# Patient Record
Sex: Female | Born: 1935
Health system: Southern US, Community
[De-identification: ages and names within clinical notes are randomized; demographics above are authoritative.]

## PROBLEM LIST (undated history)

## (undated) DIAGNOSIS — E785 Hyperlipidemia, unspecified: Secondary | ICD-10-CM

## (undated) DIAGNOSIS — F430 Acute stress reaction: Secondary | ICD-10-CM

## (undated) DIAGNOSIS — I679 Cerebrovascular disease, unspecified: Secondary | ICD-10-CM

## (undated) DIAGNOSIS — M1990 Unspecified osteoarthritis, unspecified site: Secondary | ICD-10-CM

## (undated) DIAGNOSIS — I471 Supraventricular tachycardia, unspecified: Secondary | ICD-10-CM

## (undated) DIAGNOSIS — M810 Age-related osteoporosis without current pathological fracture: Secondary | ICD-10-CM

## (undated) DIAGNOSIS — I872 Venous insufficiency (chronic) (peripheral): Secondary | ICD-10-CM

## (undated) DIAGNOSIS — N3010 Interstitial cystitis (chronic) without hematuria: Secondary | ICD-10-CM

## (undated) DIAGNOSIS — F419 Anxiety disorder, unspecified: Secondary | ICD-10-CM

## (undated) DIAGNOSIS — I639 Cerebral infarction, unspecified: Secondary | ICD-10-CM

## (undated) DIAGNOSIS — K589 Irritable bowel syndrome without diarrhea: Secondary | ICD-10-CM

## (undated) DIAGNOSIS — F5104 Psychophysiologic insomnia: Secondary | ICD-10-CM

## (undated) DIAGNOSIS — Z8619 Personal history of other infectious and parasitic diseases: Secondary | ICD-10-CM

## (undated) DIAGNOSIS — I10 Essential (primary) hypertension: Secondary | ICD-10-CM

## (undated) DIAGNOSIS — D649 Anemia, unspecified: Secondary | ICD-10-CM

## (undated) DIAGNOSIS — E559 Vitamin D deficiency, unspecified: Secondary | ICD-10-CM

## (undated) DIAGNOSIS — J309 Allergic rhinitis, unspecified: Secondary | ICD-10-CM

## (undated) DIAGNOSIS — E871 Hypo-osmolality and hyponatremia: Secondary | ICD-10-CM

## (undated) DIAGNOSIS — J45909 Unspecified asthma, uncomplicated: Secondary | ICD-10-CM

## (undated) DIAGNOSIS — K5730 Diverticulosis of large intestine without perforation or abscess without bleeding: Secondary | ICD-10-CM

## (undated) DIAGNOSIS — B0229 Other postherpetic nervous system involvement: Secondary | ICD-10-CM

## (undated) DIAGNOSIS — K580 Irritable bowel syndrome with diarrhea: Secondary | ICD-10-CM

## (undated) DIAGNOSIS — R413 Other amnesia: Secondary | ICD-10-CM

## (undated) HISTORY — DX: Cerebral infarction, unspecified: I63.9

## (undated) HISTORY — DX: Interstitial cystitis (chronic) without hematuria: N30.10

## (undated) HISTORY — DX: Psychophysiologic insomnia: F51.04

## (undated) HISTORY — DX: Personal history of other infectious and parasitic diseases: Z86.19

## (undated) HISTORY — DX: Anemia, unspecified: D64.9

## (undated) HISTORY — DX: Venous insufficiency (chronic) (peripheral): I87.2

## (undated) HISTORY — DX: Other postherpetic nervous system involvement: B02.29

## (undated) HISTORY — DX: Other amnesia: R41.3

## (undated) HISTORY — DX: Age-related osteoporosis without current pathological fracture: M81.0

## (undated) HISTORY — DX: Anxiety disorder, unspecified: F41.9

## (undated) HISTORY — DX: Vitamin D deficiency, unspecified: E55.9

## (undated) HISTORY — DX: Hyperlipidemia, unspecified: E78.5

## (undated) HISTORY — DX: Unspecified asthma, uncomplicated: J45.909

## (undated) HISTORY — DX: Essential (primary) hypertension: I10

## (undated) HISTORY — DX: Irritable bowel syndrome with diarrhea: K58.0

## (undated) HISTORY — DX: Diverticulosis of large intestine without perforation or abscess without bleeding: K57.30

## (undated) HISTORY — DX: Supraventricular tachycardia: I47.1

## (undated) HISTORY — PX: APPENDECTOMY: SHX54

## (undated) HISTORY — DX: Irritable bowel syndrome without diarrhea: K58.9

## (undated) HISTORY — DX: Allergic rhinitis, unspecified: J30.9

## (undated) HISTORY — DX: Cerebrovascular disease, unspecified: I67.9

## (undated) HISTORY — PX: TONSILLECTOMY: SUR1361

## (undated) HISTORY — DX: Hypo-osmolality and hyponatremia: E87.1

## (undated) HISTORY — DX: Supraventricular tachycardia, unspecified: I47.10

## (undated) HISTORY — DX: Unspecified osteoarthritis, unspecified site: M19.90

## (undated) HISTORY — PX: DILATION AND CURETTAGE OF UTERUS: SHX78

## (undated) HISTORY — DX: Acute stress reaction: F43.0

---

## 1997-09-18 ENCOUNTER — Other Ambulatory Visit: Admission: RE | Admit: 1997-09-18 | Discharge: 1997-09-18 | Payer: Self-pay | Admitting: Obstetrics and Gynecology

## 1997-09-30 ENCOUNTER — Other Ambulatory Visit: Admission: RE | Admit: 1997-09-30 | Discharge: 1997-09-30 | Payer: Self-pay | Admitting: Obstetrics and Gynecology

## 1998-11-18 ENCOUNTER — Encounter: Payer: Self-pay | Admitting: Internal Medicine

## 1999-02-10 ENCOUNTER — Other Ambulatory Visit: Admission: RE | Admit: 1999-02-10 | Discharge: 1999-02-10 | Payer: Self-pay | Admitting: Obstetrics and Gynecology

## 1999-02-11 ENCOUNTER — Encounter: Payer: Self-pay | Admitting: Obstetrics and Gynecology

## 1999-02-11 ENCOUNTER — Encounter: Admission: RE | Admit: 1999-02-11 | Discharge: 1999-02-11 | Payer: Self-pay | Admitting: Obstetrics and Gynecology

## 1999-04-15 ENCOUNTER — Encounter: Admission: RE | Admit: 1999-04-15 | Discharge: 1999-04-15 | Payer: Self-pay | Admitting: Obstetrics and Gynecology

## 1999-04-15 ENCOUNTER — Encounter: Payer: Self-pay | Admitting: Obstetrics and Gynecology

## 1999-05-02 ENCOUNTER — Other Ambulatory Visit: Admission: RE | Admit: 1999-05-02 | Discharge: 1999-05-02 | Payer: Self-pay | Admitting: Obstetrics and Gynecology

## 1999-05-02 ENCOUNTER — Encounter (INDEPENDENT_AMBULATORY_CARE_PROVIDER_SITE_OTHER): Payer: Self-pay

## 2000-05-30 ENCOUNTER — Encounter: Admission: RE | Admit: 2000-05-30 | Discharge: 2000-05-30 | Payer: Self-pay | Admitting: Obstetrics and Gynecology

## 2000-05-30 ENCOUNTER — Encounter: Payer: Self-pay | Admitting: Obstetrics and Gynecology

## 2000-05-30 ENCOUNTER — Other Ambulatory Visit: Admission: RE | Admit: 2000-05-30 | Discharge: 2000-05-30 | Payer: Self-pay | Admitting: Obstetrics and Gynecology

## 2001-05-28 ENCOUNTER — Encounter: Payer: Self-pay | Admitting: Obstetrics and Gynecology

## 2001-05-28 ENCOUNTER — Encounter: Admission: RE | Admit: 2001-05-28 | Discharge: 2001-05-28 | Payer: Self-pay | Admitting: Obstetrics and Gynecology

## 2001-05-28 ENCOUNTER — Other Ambulatory Visit: Admission: RE | Admit: 2001-05-28 | Discharge: 2001-05-28 | Payer: Self-pay | Admitting: Obstetrics and Gynecology

## 2002-08-26 ENCOUNTER — Other Ambulatory Visit: Admission: RE | Admit: 2002-08-26 | Discharge: 2002-08-26 | Payer: Self-pay | Admitting: Obstetrics and Gynecology

## 2002-08-26 ENCOUNTER — Encounter: Admission: RE | Admit: 2002-08-26 | Discharge: 2002-08-26 | Payer: Self-pay | Admitting: Obstetrics and Gynecology

## 2002-08-26 ENCOUNTER — Encounter: Payer: Self-pay | Admitting: Obstetrics and Gynecology

## 2002-09-04 ENCOUNTER — Encounter: Admission: RE | Admit: 2002-09-04 | Discharge: 2002-09-04 | Payer: Self-pay | Admitting: Obstetrics and Gynecology

## 2002-09-04 ENCOUNTER — Encounter: Payer: Self-pay | Admitting: Obstetrics and Gynecology

## 2003-09-15 ENCOUNTER — Other Ambulatory Visit: Admission: RE | Admit: 2003-09-15 | Discharge: 2003-09-15 | Payer: Self-pay | Admitting: Obstetrics and Gynecology

## 2003-09-15 ENCOUNTER — Encounter: Admission: RE | Admit: 2003-09-15 | Discharge: 2003-09-15 | Payer: Self-pay | Admitting: Obstetrics and Gynecology

## 2004-07-12 ENCOUNTER — Ambulatory Visit: Payer: Self-pay | Admitting: Internal Medicine

## 2004-07-25 ENCOUNTER — Ambulatory Visit: Payer: Self-pay | Admitting: Internal Medicine

## 2004-09-16 ENCOUNTER — Encounter: Admission: RE | Admit: 2004-09-16 | Discharge: 2004-09-16 | Payer: Self-pay | Admitting: Obstetrics and Gynecology

## 2004-09-16 ENCOUNTER — Other Ambulatory Visit: Admission: RE | Admit: 2004-09-16 | Discharge: 2004-09-16 | Payer: Self-pay | Admitting: Obstetrics and Gynecology

## 2005-09-18 ENCOUNTER — Other Ambulatory Visit: Admission: RE | Admit: 2005-09-18 | Discharge: 2005-09-18 | Payer: Self-pay | Admitting: Obstetrics & Gynecology

## 2005-09-18 ENCOUNTER — Encounter: Admission: RE | Admit: 2005-09-18 | Discharge: 2005-09-18 | Payer: Self-pay | Admitting: Obstetrics and Gynecology

## 2006-01-05 ENCOUNTER — Ambulatory Visit: Payer: Self-pay | Admitting: Internal Medicine

## 2006-06-17 ENCOUNTER — Inpatient Hospital Stay (HOSPITAL_COMMUNITY): Admission: EM | Admit: 2006-06-17 | Discharge: 2006-06-18 | Payer: Self-pay | Admitting: Emergency Medicine

## 2006-06-17 ENCOUNTER — Ambulatory Visit: Payer: Self-pay | Admitting: Cardiology

## 2006-07-03 ENCOUNTER — Ambulatory Visit: Payer: Self-pay

## 2006-07-27 ENCOUNTER — Ambulatory Visit: Payer: Self-pay | Admitting: Cardiology

## 2006-08-20 ENCOUNTER — Ambulatory Visit: Payer: Self-pay | Admitting: Internal Medicine

## 2006-09-27 ENCOUNTER — Encounter: Admission: RE | Admit: 2006-09-27 | Discharge: 2006-09-27 | Payer: Self-pay | Admitting: Obstetrics and Gynecology

## 2006-09-27 ENCOUNTER — Other Ambulatory Visit: Admission: RE | Admit: 2006-09-27 | Discharge: 2006-09-27 | Payer: Self-pay | Admitting: Obstetrics and Gynecology

## 2006-12-25 ENCOUNTER — Ambulatory Visit: Payer: Self-pay | Admitting: Internal Medicine

## 2007-06-11 ENCOUNTER — Encounter: Admission: RE | Admit: 2007-06-11 | Discharge: 2007-06-11 | Payer: Self-pay | Admitting: Dermatology

## 2007-07-04 ENCOUNTER — Encounter: Admission: RE | Admit: 2007-07-04 | Discharge: 2007-07-04 | Payer: Self-pay | Admitting: Diagnostic Radiology

## 2007-07-11 ENCOUNTER — Encounter: Admission: RE | Admit: 2007-07-11 | Discharge: 2007-07-11 | Payer: Self-pay | Admitting: Diagnostic Radiology

## 2007-07-30 ENCOUNTER — Encounter: Admission: RE | Admit: 2007-07-30 | Discharge: 2007-07-30 | Payer: Self-pay | Admitting: Diagnostic Radiology

## 2007-07-30 ENCOUNTER — Ambulatory Visit: Payer: Self-pay | Admitting: Cardiology

## 2007-09-30 ENCOUNTER — Other Ambulatory Visit: Admission: RE | Admit: 2007-09-30 | Discharge: 2007-09-30 | Payer: Self-pay | Admitting: Obstetrics and Gynecology

## 2007-09-30 ENCOUNTER — Encounter: Admission: RE | Admit: 2007-09-30 | Discharge: 2007-09-30 | Payer: Self-pay | Admitting: Obstetrics and Gynecology

## 2007-10-08 ENCOUNTER — Encounter: Admission: RE | Admit: 2007-10-08 | Discharge: 2007-10-08 | Payer: Self-pay | Admitting: Obstetrics and Gynecology

## 2008-01-21 ENCOUNTER — Encounter: Admission: RE | Admit: 2008-01-21 | Discharge: 2008-01-21 | Payer: Self-pay | Admitting: Interventional Radiology

## 2008-02-18 ENCOUNTER — Encounter: Admission: RE | Admit: 2008-02-18 | Discharge: 2008-02-18 | Payer: Self-pay | Admitting: Dermatology

## 2008-05-06 ENCOUNTER — Telehealth: Payer: Self-pay | Admitting: Internal Medicine

## 2008-05-06 DIAGNOSIS — K589 Irritable bowel syndrome without diarrhea: Secondary | ICD-10-CM

## 2008-05-06 DIAGNOSIS — K5900 Constipation, unspecified: Secondary | ICD-10-CM

## 2008-05-06 DIAGNOSIS — K5730 Diverticulosis of large intestine without perforation or abscess without bleeding: Secondary | ICD-10-CM | POA: Insufficient documentation

## 2008-05-06 DIAGNOSIS — I679 Cerebrovascular disease, unspecified: Secondary | ICD-10-CM | POA: Insufficient documentation

## 2008-05-06 DIAGNOSIS — E785 Hyperlipidemia, unspecified: Secondary | ICD-10-CM

## 2008-05-06 HISTORY — DX: Irritable bowel syndrome, unspecified: K58.9

## 2008-05-06 HISTORY — DX: Constipation, unspecified: K59.00

## 2008-05-11 ENCOUNTER — Ambulatory Visit: Payer: Self-pay | Admitting: Internal Medicine

## 2008-05-12 ENCOUNTER — Telehealth: Payer: Self-pay | Admitting: Internal Medicine

## 2008-05-12 ENCOUNTER — Ambulatory Visit: Payer: Self-pay | Admitting: Internal Medicine

## 2008-06-22 ENCOUNTER — Telehealth: Payer: Self-pay | Admitting: Internal Medicine

## 2008-07-28 ENCOUNTER — Ambulatory Visit: Payer: Self-pay | Admitting: Internal Medicine

## 2008-07-28 DIAGNOSIS — J302 Other seasonal allergic rhinitis: Secondary | ICD-10-CM

## 2008-07-28 DIAGNOSIS — J3089 Other allergic rhinitis: Secondary | ICD-10-CM

## 2008-07-28 HISTORY — DX: Other seasonal allergic rhinitis: J30.89

## 2008-07-28 HISTORY — DX: Other seasonal allergic rhinitis: J30.2

## 2008-09-25 IMAGING — US IR TRANSCATH EMBOLIZATION NON-NEURO EA OP FIELD
1 series · 2 of 2 positions shown · non-contrast
Comparison: none

CLINICAL DATA: Left leg pain with superficial venous reflux disease
and varicosities.

[Series 1: ir transcath embolization non-neuro ea op field · 0.06mm/px · 2 of 2 slices shown]
[im 1/2]
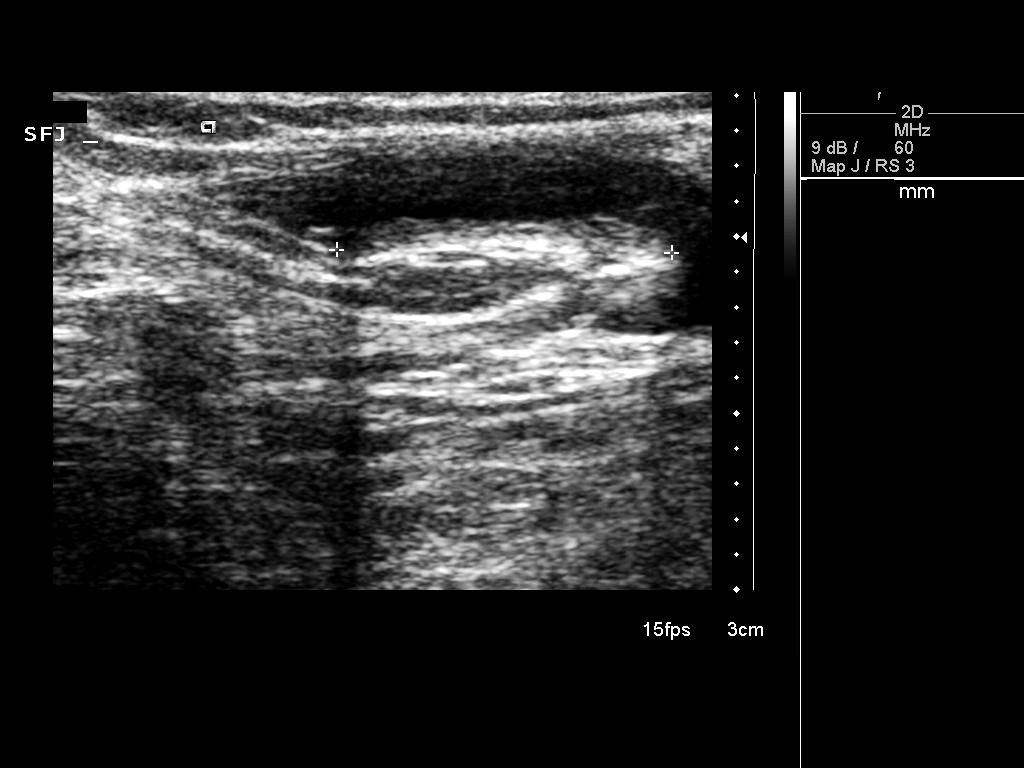
[im 2/2]
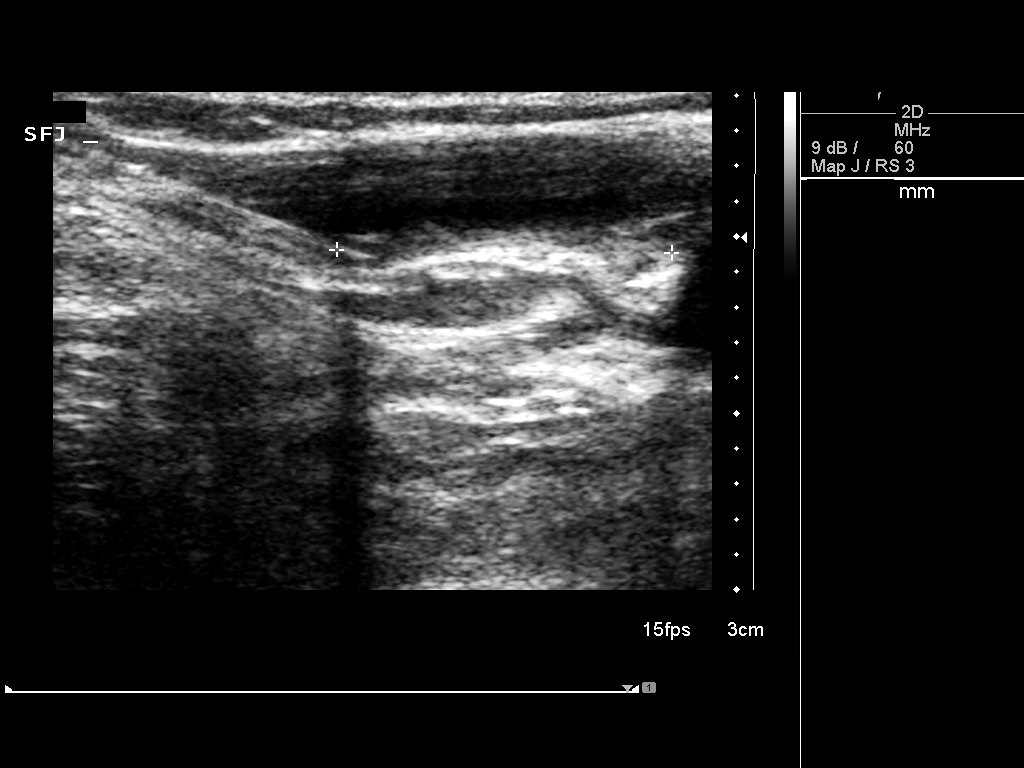

[2 of 2 positions shown; findings below may reference images not displayed]

ENDOVASCULAR LASER TREATMENT OF THE LEFT GREAT SAPHENOUS VEIN

Laser Details:  Laser fiber diameter:  600 microns; power 10 Ammie;

Procedure:  The risks of the procedure including bleeding,
infection and DVT were explained to the patient.  Informed consent
was obtained.  The patient was placed supine on the ultrasound
examination table.  The left lower extremity was evaluated with
duplex ultrasound.  Multiple varicosities were identified near the
knee.  The great saphenous vein was markedly dilated in the distal
thigh region.  The left thigh and upper calf region were prepped
with Betadine and a sterile drape was placed.  Skin was
anesthetized with 1% lidocaine.  A 21 gauge needle was directed
into the great saphenous vein just below the knee and above the
large varicosities.  A micropuncture dilator set was placed.  The
vascular sheath was positioned in the proximal great saphenous vein
a few centimeters distal to the saphenofemoral junction.  The 600
micron laser was placed within the sheath and the tip was
positioned 18 mm from the saphenofemoral junction.  Dilute
lidocaine was used for tumescence along the length of the great
saphenous vein.  Additional tumescence was used in the distal thigh
due to the superficial location of the great saphenous vein in this
area.  The laser position was again confirmed with ultrasound.  The
laser and sheath were pulled back as a single unit for 158 seconds.
The patient tolerated procedure well without complication.  Sterile
dressings were placed over the puncture sites and the patient was
placed in her compression stockings.  The patient ambulated prior
to being discharged. There were no immediate complications.
IMPRESSION: Endovascular laser treatment of the left great saphenous vein
starting just below the left knee.  The patient tolerated the
procedure well and will return for a follow-up visit in 1 week.

## 2008-09-30 ENCOUNTER — Encounter: Admission: RE | Admit: 2008-09-30 | Discharge: 2008-09-30 | Payer: Self-pay | Admitting: Obstetrics and Gynecology

## 2008-10-21 IMAGING — US US EXTREM LOW VENOUS*L*
1 series · 13 of 24 positions shown · non-contrast
Comparison: 07/11/2007

CLINICAL DATA: 1 month status post endovenous laser occlusion of
the left great saphenous vein on 07/04/2007.

LEFT LOWER EXTREMITY VENOUS DUPLEX ULTRASOUND
TECHNIQUE: Gray-scale sonography with graded compression, as well
as color Doppler and duplex ultrasound, were performed to evaluate
the deep and superficial venous system of the left lower extremity
from the level of the common femoral vein through the popliteal and
proximal calf veins. Spectral Doppler was utilized to evaluate flow
at rest and with distal augmentation maneuvers.

[Series 1: us extrem low venous*left* · 13 of 28 slices shown]
[im 1/28]
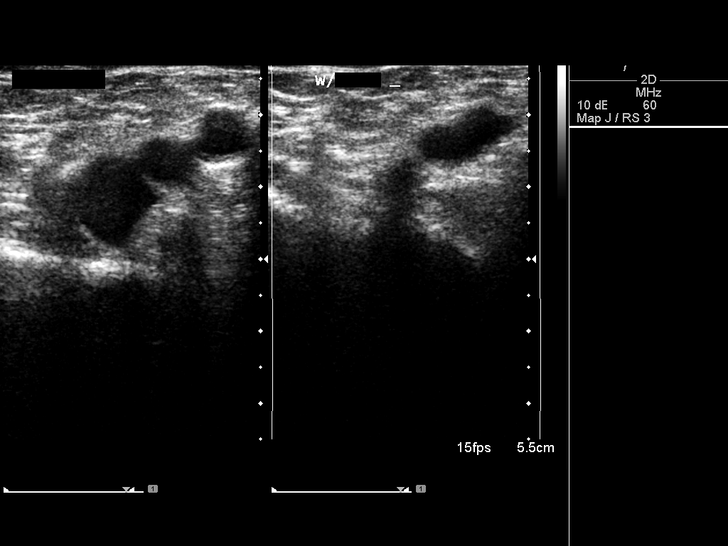
[im 3/28]
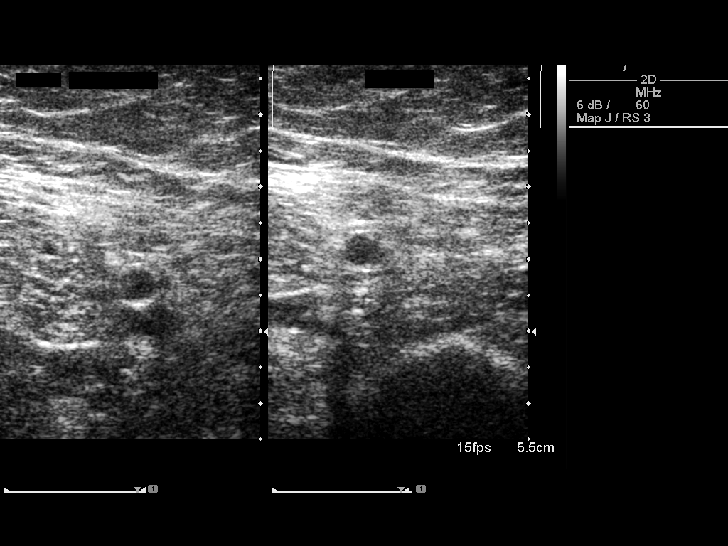
[im 5/28]
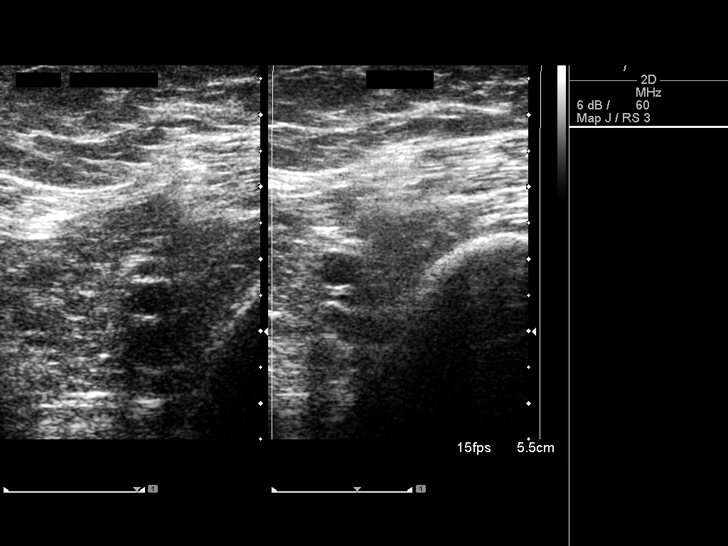
[im 8/28]
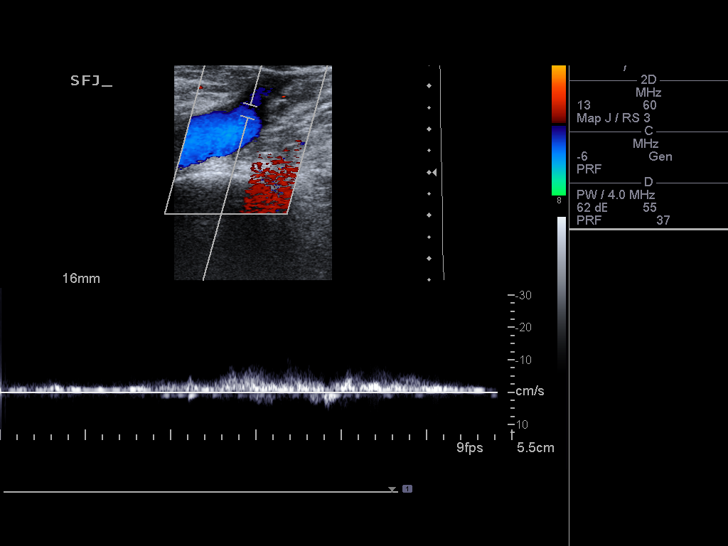
[im 10/28]
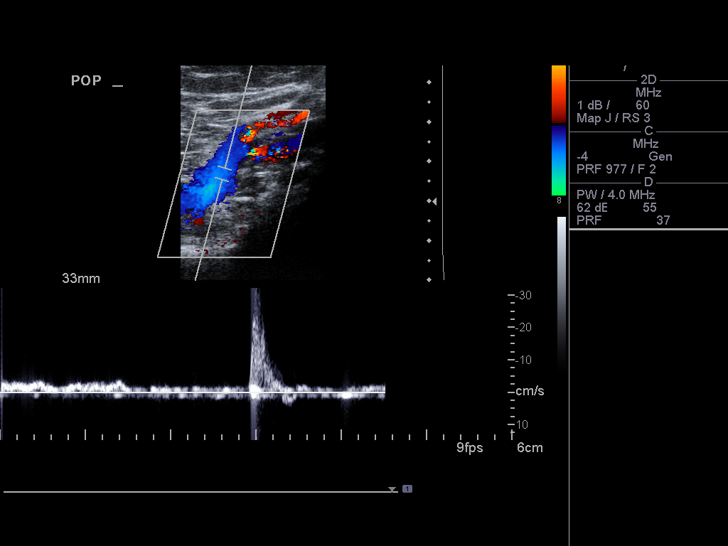
[im 12/28]
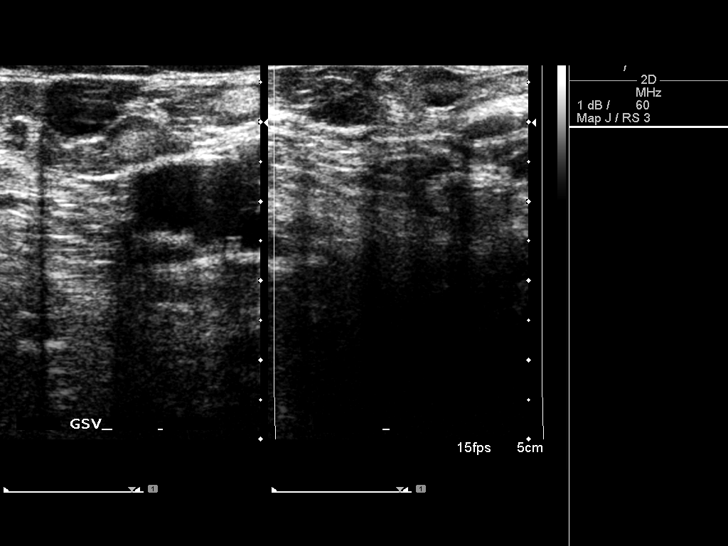
[im 15/28]
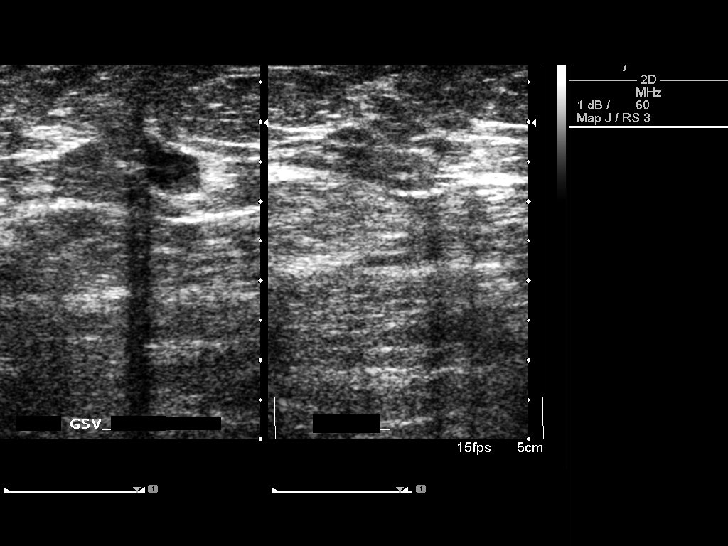
[im 16/28]
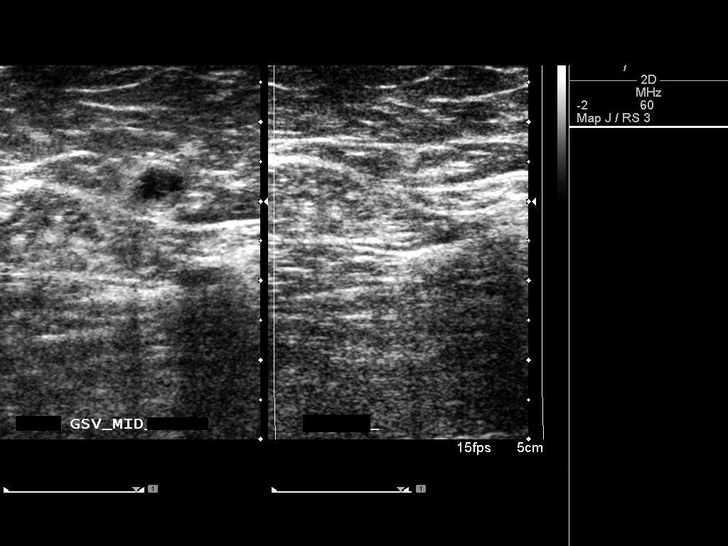
[im 18/28]
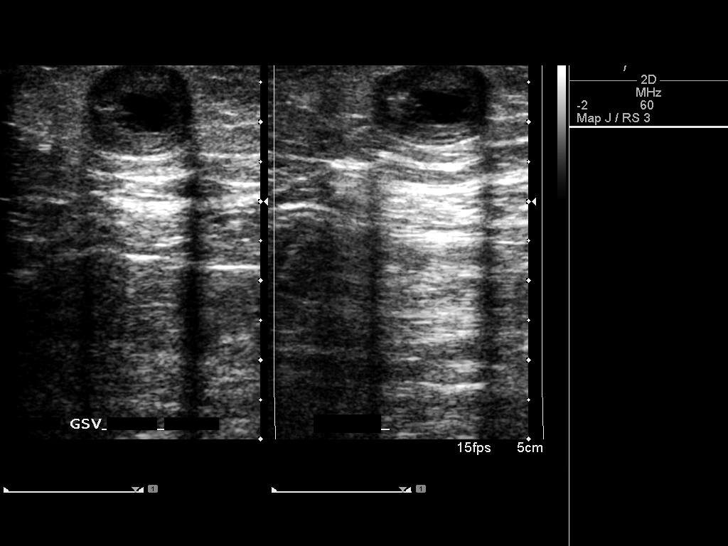
[im 20/28]
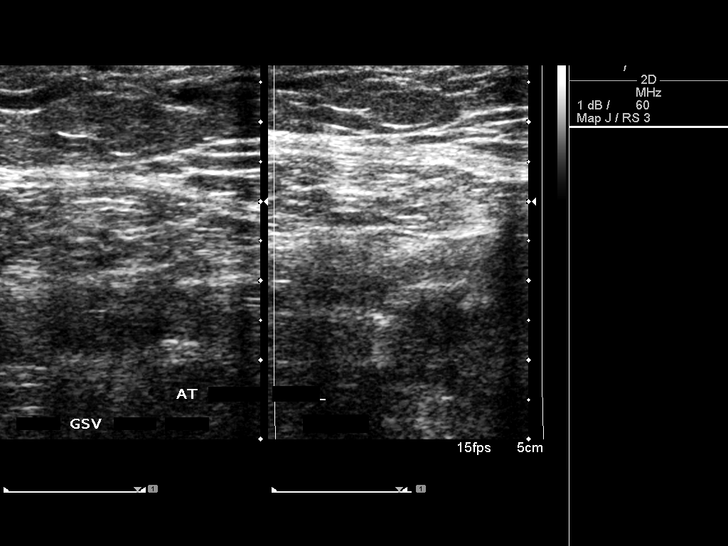
[im 23/28]
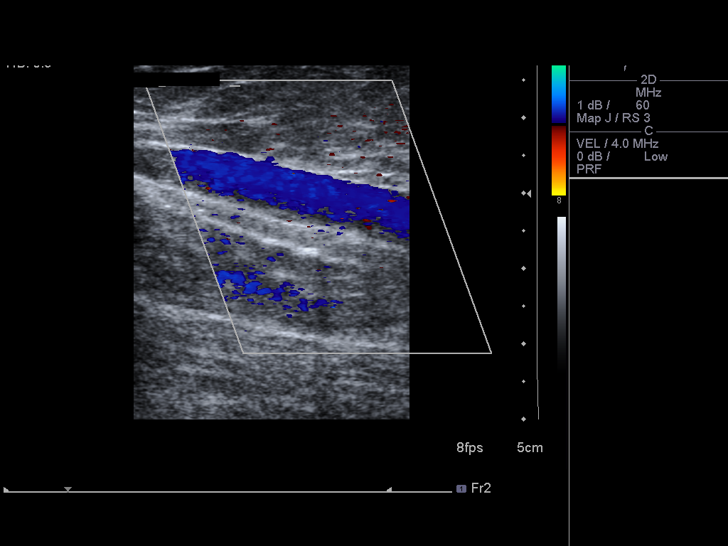
[im 25/28]
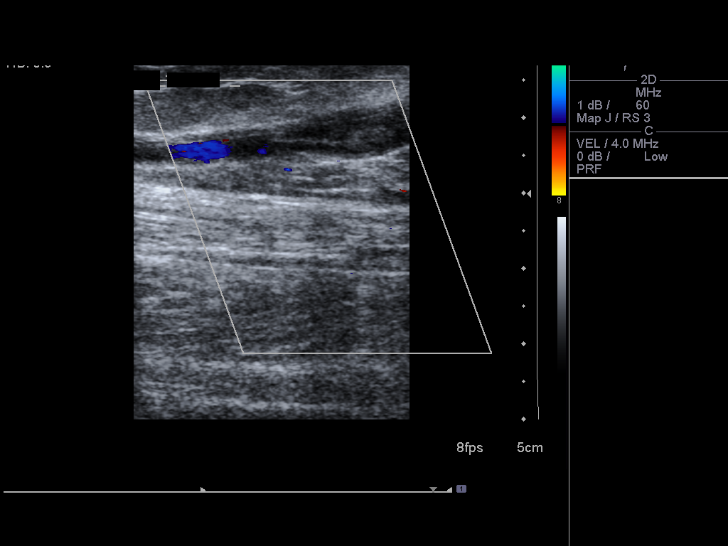
[im 28/28]
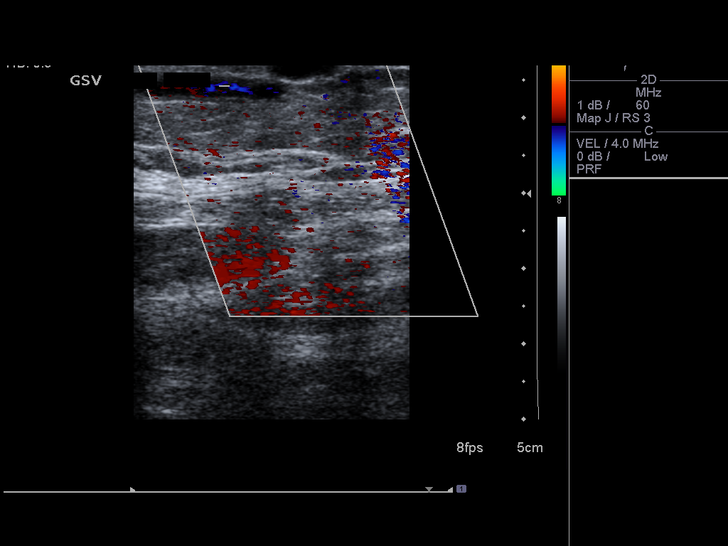

[13 of 24 positions shown; findings below may reference images not displayed]

FINDINGS: The deep venous system remains widely patent with no
evidence of DVT.  Deep veins show normal augmentation and
phasicity.

The saphenofemoral junction remains widely patent.  Proximal
segment of the great saphenous vein in the upper thigh is open.
The mid thigh segment shows partially occlusive thrombus in the
dependent aspect of the vein.  Distal GSV demonstrates complete
occlusion just above the knee including continued visualization of
thrombus in a dilated aneurysmal segment of the vein just above the
knee.

Below the knee, the great saphenous vein is largely thrombosed in
the proximal calf with a small amount of internal flow.  There is a
network of adjacent superficial varicosities which remain open.
These partially communicate with a segment of the great saphenous
vein in the proximal calf as well as via at least one perforator to
the deep venous system.
IMPRESSION: Partial thrombosis of treated left great saphenous vein.
Aneurysmal segment in the distal thigh shows complete thrombosis.
Other segments demonstrate nonocclusive thrombus.  Superficial
varicosities in the proximal calf remain open primarily due to
probable perforator supply.  One segment does appear to partially
communicate with a segment of the open great saphenous vein in the
proximal calf.

## 2008-11-30 ENCOUNTER — Telehealth: Payer: Self-pay | Admitting: Internal Medicine

## 2009-09-15 ENCOUNTER — Ambulatory Visit: Payer: Self-pay | Admitting: Internal Medicine

## 2009-09-15 DIAGNOSIS — I10 Essential (primary) hypertension: Secondary | ICD-10-CM

## 2009-09-15 HISTORY — DX: Essential (primary) hypertension: I10

## 2009-09-30 ENCOUNTER — Telehealth: Payer: Self-pay | Admitting: Internal Medicine

## 2009-10-01 ENCOUNTER — Encounter: Admission: RE | Admit: 2009-10-01 | Discharge: 2009-10-01 | Payer: Self-pay | Admitting: Obstetrics and Gynecology

## 2010-04-24 ENCOUNTER — Encounter: Payer: Self-pay | Admitting: Dermatology

## 2010-05-03 NOTE — Assessment & Plan Note (Signed)
Summary: 12 months/apc   Primary Provider/Referring Provider:  Celine Mans, MD  CC:  Follow up visit-"Dripping nose" x 3 months..  History of Present Illness: 12/25/06- 1. Allergic rhinitis. 2. Allergic conjunctivitis.  HISTORY:  One year followup for this never smoker who says Nasonex was working well until she ran out a few months ago.  She did not try to get it refilled.  She complains of "allergies" all summer, meaning watering eyes, nasal congestion, and a complaint that the skin of her face feels dry.  She asks if I would refill skin meds that she had been provided in the past by a dermatologist and uses sparingly for scaling and rash on the face.  There have been no chest complaints, nothing purulent or bloody.   07/28/08- allergic rhinitis, allergic conjunctivitis       Husband with her. Some postnasal drip. Discussed loss of daughter in [place]. Had a little wheeze/ cough this spring. Denies fever, sore throat, or purulent discharge. Never felt need for asthma inhaler. Blood pressure labile - ER x 2 or 3. Denies need for bronchodilator or decongestant and we discussed meds including her BP meds.  September 15, 2009- Allergic rhinitis, allergic conjunctivitis, We had given sample Astepro to try. Now says nose has been running several months- waited to try nasonex until seen here, but says the sample of Astepro was good. No let-up yet with advancing season. Mild eye itching, no wheeze or cough.  Now on pain meds for Shingles pain left buttock.  Preventive Screening-Counseling & Management  Alcohol-Tobacco     Smoking Status: never     Tobacco Counseling: not indicated; no tobacco use  Current Medications (verified): 1)  Metoprolol Tartrate 50 Mg Tabs (Metoprolol Tartrate) .... Take 1 1/2 By Mouth  Every Morning and 2 By Mouth At Bedtime 2)  Avapro 300 Mg Tabs (Irbesartan) .... One Tablet By Mouth Once Daily 3)  Fish Oil 1000 Mg Caps (Omega-3 Fatty Acids) .... One Capsule By  Mouth Once Daily 4)  Multivitamins   Tabs (Multiple Vitamin) .... One Tablet By Mouth Once Daily 5)  Calcium Citrate-Vitamin D 1500-200 Mg-Unit Tabs (Calcium Citrate-Vitamin D) .... One Tablet By Mouth Once Daily 6)  Adult Aspirin Low Strength 81 Mg Tbdp (Aspirin) .... Take 1 By Mouth Once Daily 7)  Protonix 40 Mg Tbec (Pantoprazole Sodium) .... Take 1 By Mouth Once Daily 8)  Clonidine Hcl 0.1 Mg Tabs (Clonidine Hcl) .... Take 1 By Mouth Once Daily As Needed 9)  Crestor 5 Mg Tabs (Rosuvastatin Calcium) .... Take 1 By Mouth Once Daily 10)  Nasonex 50 Mcg/act Susp (Mometasone Furoate) .Marland Kitchen.. 1-2 Sprays Each Nostril Daily 11)  Lyrica 100 Mg Caps (Pregabalin) .... Take 400mg  Once Daily 12)  Cymbalta 60 Mg Cpep (Duloxetine Hcl) .... Take 1 By Mouth Once Daily  Allergies (verified): 1)  ! Sulfa  Past History:  Past Surgical History: Last updated: 05/06/2008 Appendectomy Tonsillectomy  Family History: Last updated: 07/28/2008 No FH of Colon Cancer: Family History of Heart Disease: Father, Sister Daughter- died cerebral hemorrhage, had been Sales executive in [place]  Social History: Last updated: 05/11/2008 Married Alcohol Use - yes-occasional wine Illicit Drug Use - no Patient has never smoked.  Daily Caffeine Use  Risk Factors: Smoking Status: never (09/15/2009)  Past Medical History: DIVERTICULOSIS, COLON (ICD-562.10) IRRITABLE BOWEL SYNDROME (ICD-564.1) CEREBROVASCULAR DISEASE (ICD-437.9) HYPERLIPIDEMIA (ICD-272.4) Hx Shingles Allergic Rhinitis Hypertension  Review of Systems      See HPI  The patient denies shortness of  breath with activity, shortness of breath at rest, productive cough, non-productive cough, coughing up blood, chest pain, irregular heartbeats, acid heartburn, indigestion, loss of appetite, weight change, abdominal pain, difficulty swallowing, sore throat, tooth/dental problems, headaches, nasal congestion/difficulty breathing through nose, sneezing,  itching, and ear ache.    Vital Signs:  Patient profile:   77 year old female Height:      63 inches Weight:      119 pounds BMI:     21.16 O2 Sat:      99 % on Room air Pulse rate:   53 / minute BP sitting:   136 / 80  (left arm) Cuff size:   regular  Vitals Entered By: Reynaldo Minium CMA (September 15, 2009 11:31 AM)  O2 Flow:  Room air CC: Follow up visit-"Dripping nose" x 3 months.   Physical Exam  Additional Exam:  General: A/Ox3; pleasant and cooperative, NAD, trim, comfortable appearing SKIN: no rash, lesions NODES: no lymphadenopathy HEENT: [place]/AT, EOM- WNL, Conjuctivae- clear, PERRLA, TM-WNL, Nose- clear, mucosa somewhat atrophic without polyps or erosion, Throat- clear and wnl, Mallampati II NECK: Supple w/ fair ROM, JVD- none, normal carotid impulses w/o bruits Thyroid- CHEST: Clear to P&A HEART: RRR, no m/g/r heard ABDOMEN: trim NUU:VOZD, nl pulses, no edema  NEURO: Grossly intact to observation      Impression & Recommendations:  Problem # 1:  ALLERGIC RHINITIS (ICD-477.9)  Persistent exacerbation of rhinitis. Probably initially pollen related and now vasomotor/ irritant related to air quality. We will see if episodic intermittent use of a nasal antihistamine will meet her needs. We can add back Nasonex later if needed. Her updated medication list for this problem includes:    Nasonex 50 Mcg/act Susp (Mometasone furoate) .Marland Kitchen... 1-2 sprays each nostril daily    Astepro 0.15 % Soln (Azelastine hcl) .Marland Kitchen... 1-2 puffs each nostril up to three times a day as needed  Medications Added to Medication List This Visit: 1)  Metoprolol Tartrate 50 Mg Tabs (Metoprolol tartrate) .... Take 1 1/2 by mouth  every morning and 2 by mouth at bedtime 2)  Lyrica 100 Mg Caps (Pregabalin) .... Take 400mg  once daily 3)  Cymbalta 60 Mg Cpep (Duloxetine hcl) .... Take 1 by mouth once daily 4)  Astepro 0.15 % Soln (Azelastine hcl) .Marland Kitchen.. 1-2 puffs each nostril up to three times a day as  needed  Other Orders: Est. Patient Level III (66440)  Patient Instructions: 1)  Please schedule a follow-up appointment in 1 year. 2)  Sample / script for Astepro nasal antihistamine: 3)  1-2 puffs each nostril up to three times a day if needed.  Prescriptions: ASTEPRO 0.15 % SOLN (AZELASTINE HCL) 1-2 puffs each nostril up to three times a day as needed  #1 x prn   Entered and Authorized by:   Waymon Budge MD   Signed by:   Waymon Budge MD on 09/15/2009   Method used:   Print then Give to Patient   RxID:   [id number]

## 2010-05-03 NOTE — Progress Notes (Signed)
Summary: ? nasal sprays-LMTCB x 1  Phone Note Call from Patient   Caller: Patient Call For: young Summary of Call: astepro did not work gave pt nasonex sample until prescript can be called in Initial call taken by: Rickard Patience,  September 30, 2009 2:07 PM  Follow-up for Phone Call        [facility] Vernie Murders  September 30, 2009 2:11 PM  Verlon Au gave the pt a sample of Nasonex earlier today. She says the Astepro did not work well for her.  She will contact her pharmacy when needing a refill since she has used this medication in the past. Will forward to CDY so he is aware.. Follow-up by: Michel Bickers CMA,  September 30, 2009 3:47 PM  Additional Follow-up for Phone Call Additional follow up Details #1::        OK- I have refilled the Nasonex on med list     Prescriptions: NASONEX 50 MCG/ACT SUSP (MOMETASONE FUROATE) 1-2 sprays each nostril daily  #1 x prn   Entered by:   Waymon Budge MD   Authorized by:   Pulmonary Triage   Signed by:   Waymon Budge MD on 09/30/2009   Method used:   Historical   RxID:   [id number]

## 2010-05-26 ENCOUNTER — Telehealth (INDEPENDENT_AMBULATORY_CARE_PROVIDER_SITE_OTHER): Payer: Self-pay | Admitting: *Deleted

## 2010-05-31 NOTE — Progress Notes (Signed)
Summary: rx request for Nasonex  Phone Note Call from Patient Call back at Home Phone [phone]   Caller: Patient Call For: young Summary of Call: pt requests rx for nasonex. she has tried the astepro as dr young had rec but she prefers nasonex. [facility] in [place] [phone] Initial call taken by: Tivis Ringer, CNA,  May 26, 2010 1:36 PM  Follow-up for Phone Call        per phone note from 09/30/2009 CY had ok'd changing pt from Asteopro to nasonex and sent rx.  called and spoke with pt.  pt requesting rx for nasonex be sent to pharmacy.  rx sent.  pt aware. Marland KitchenArman Filter LPN  May 26, 2010 1:41 PM     Prescriptions: NASONEX 50 MCG/ACT SUSP (MOMETASONE FUROATE) 1-2 sprays each nostril daily  #1 x 3   Entered by:   Arman Filter LPN   Authorized by:   Waymon Budge MD   Signed by:   Arman Filter LPN on 47/82/9562   Method used:   Electronically to        [facility]. [phone]* (retail)       [address]       [place], [place]  [address]       Ph: [phone] or [phone]       Fax: [phone]   RxID:   [id number]

## 2010-08-02 ENCOUNTER — Other Ambulatory Visit: Payer: Self-pay | Admitting: Obstetrics and Gynecology

## 2010-08-02 DIAGNOSIS — Z1231 Encounter for screening mammogram for malignant neoplasm of breast: Secondary | ICD-10-CM

## 2010-08-03 ENCOUNTER — Telehealth: Payer: Self-pay | Admitting: Internal Medicine

## 2010-08-03 NOTE — Telephone Encounter (Signed)
Pt scheduled for f/u on Fri., 5/4 w/ CDY to discuss allergy symptoms.

## 2010-08-05 ENCOUNTER — Encounter: Payer: Self-pay | Admitting: Internal Medicine

## 2010-08-05 ENCOUNTER — Ambulatory Visit (INDEPENDENT_AMBULATORY_CARE_PROVIDER_SITE_OTHER): Payer: Medicare Other | Admitting: Internal Medicine

## 2010-08-05 VITALS — BP 110/68 | HR 63 | Ht 62.5 in | Wt 118.2 lb

## 2010-08-05 DIAGNOSIS — J309 Allergic rhinitis, unspecified: Secondary | ICD-10-CM

## 2010-08-05 DIAGNOSIS — I10 Essential (primary) hypertension: Secondary | ICD-10-CM

## 2010-08-05 MED ORDER — PREDNISONE 20 MG PO TABS: 20.0000 mg | ORAL_TABLET | Freq: Every day | ORAL | Status: AC

## 2010-08-05 NOTE — Assessment & Plan Note (Addendum)
Seasonal exacerbation. No help so far from Nasonex recently started, or from loratadine. She complains of dry mouth. I will try low dose prednisone for a week. There has been a little subclinical asthma or bronchitis. DDX would be viral infection. I don't think she is describing cardiac symptoms. We discussed her blood pressure meds for interaction and side effects.

## 2010-08-05 NOTE — Assessment & Plan Note (Signed)
She describes difficulty this year getting BP under control. That raises concern that the chest pressure she describes might be cardiac. We will watch response to prednisone but ask her to talk to her PCP.

## 2010-08-05 NOTE — Progress Notes (Signed)
  Subjective:    Patient ID: [name] West, female    DOB: Feb 29, 1930, 77 y.o.   MRN: [id number]  HPI 77 yoF followed for allergic rhinitis, complicated by hypertension and hx of CVA.here with daughter.  Last here- September 16, 2010.  Now says 3 months of increased watery eyes and nose. Refilled Nasonex but it hasn't helped. Scant yellow but no evident infection. Off and on for 2-3 days she has had chest pressure, especially yesterday. Denies pain, palpitation, nausea or sweats. Some smothered supine.Dry cough this week, maybe with some wheeze.  Denies choke or strangle with food or drink. Mouth feels dry despite rhinorhea.  Another daughter had died in [place]- subsequently BP was very high till treated at [facility]. At [facility] last week- claritin didn't seem to help watery eyes/ nose at all.    Review of Systems See HPI Constitutional:   No weight loss, night sweats,  Fevers, chills, fatigue, lassitude. HEENT:   No headaches,  Difficulty swallowing,  Tooth/dental problems,  Sore throat,               CV:  No chest pain,  Orthopnea, PND, swelling in lower extremities, anasarca, dizziness, palpitations  GI  No heartburn, indigestion, abdominal pain, nausea, vomiting, diarrhea, change in bowel habits, loss of appetite  Resp: No excess mucus, No coughing up of blood.  No change in color of mucus.  No wheezing.  Skin: no rash or lesions.  GU: no dysuria, change in color of urine, no urgency or frequency.  No flank pain.  MS:  No joint pain or swelling.  No decreased range of motion.  No back pain.  Psych:  No change in mood or affect. No depression or anxiety.  No memory loss.      Objective:   Physical Exam General- Alert, Oriented, Affect-appropriate, Distress- none acute     Well-appearing , calm comfortable elderly woman  Skin- rash-none, lesions- none, excoriation- none  Lymphadenopathy- none  Head- atraumatic  Eyes- Gross vision intact, PERRLA, conjunctivae clear  secretions  Ears- Normal- Hearing, canals, Tm  Nose- Clear,  No-Septal dev, mucus, polyps, erosion, perforation   Throat- Mallampati II , mucosa clear , drainage- none, tonsils- atrophic  Neck- flexible , trachea midline, no stridor , thyroid nl, carotid no bruit  Chest - symmetrical excursion , unlabored     Heart/CV- RRR , no murmur , no gallop  , no rub, nl s1 s2                     - JVD- none , edema- none, stasis changes- none, varices- none     Lung- clear to P&A, wheeze- none, cough- none , dullness-none, rub- none   Unlabored with no cough at all while here     Chest wall-   Abd- tender-no, distended-no, bowel sounds-present, HSM- no  Br/ Gen/ Rectal- Not done, not indicated  Extrem- cyanosis- none, clubbing, none, atrophy- none, strength- nl  Neuro- grossly intact to observation         Assessment & Plan:

## 2010-08-05 NOTE — Patient Instructions (Signed)
I'm not sure all of your discomforts are related. I have sent a script for prednisone to take for a few days, so we can see if that kind of medicine helps with your eyes and nose.  I am not sure about the chest pressure feeling, especially since you have had the high blood pressure history. Please let Dr Royal Hawthorn know if you keep having that feeling.   It would be ok to  Try a different antihistamine like allegra/ fexofenadine if needed for allergy discomfort.

## 2010-08-11 ENCOUNTER — Encounter: Payer: Self-pay | Admitting: Internal Medicine

## 2010-08-16 NOTE — Assessment & Plan Note (Signed)
[facility]                         GASTROENTEROLOGY OFFICE NOTE   NAME:[name] West, [name] West                    MRN:          [id number]  DATE:08/20/2006                            DOB:          Aug 08, 1929    Ms. Weidinger is a delightful 77 year old patient of Dr. Royal Hawthorn and Dr.  Tresa Res whom we have followed since 1990 for irritable bowel syndrome,  diverticulosis and now new onset of intermittent painless rectal  bleeding.  Ms. Dunk had her first colonoscopy in 1990 to evaluate  regular bowel habits, diarrhea and dyspepsia.  She was found to have  moderately severe diverticulosis of the sigmoid colon.  She has been on  Bellergal since then on p.r.n. basis, which she likes to take  occasionally, with great success.  She also is on a high fiber diet but  no specific fiiber supplements.  She had a repeat colonoscopy in 2000  and again showed moderately severe diverticulosis of the left colon,  also in descending colon.  We plan to repeat the colonoscopy in 10  years, which would be in August, 2010.  Earlier this year patient  developed visual changes which were later on diagnosed as possible CVA.  She has been on Plavix 75 mg a day.  Even prior to getting on Plavix,  she has noticed occasionally small amounts of bright red blood per  rectum when she wipes.  She gives history of hemorrhoids with her first  pregnancy 52 years ago.  She denied any prolapse of the rectal tissue.  She was told to have an anal fissure by another physician in the past.  Her bowel habits are now quite regular, every other day.  Her recent  hemoglobin was normal at 12.3 and 12.   MEDICATIONS:  1. Fosamax weekly.  2. Multiple vitamins, Vitamin E, B12, C.  3. Metoprolol 50 mL ER 25 mg b.i.d.  4. Crestor 10 mg p.o. daily.  5. Plavix 75 mg p.o. daily.  6. ProctoFoam 3-4 times daily.   PHYSICAL EXAM:  Blood pressure 130/70, pulse 64 and weight 127 pounds.  She was alert,  oriented, in no distress.  SCLERA:  Nonicteric.  NECK:  Supple, no adenopathy.  LUNGS:  Clear to auscultation.  COR:  Normal S1, normal S2.  ABDOMEN:  Soft, nontender with normoactive bowel sounds.  A palpable  sigmoid colon, somewhat firm but no tenderness and no definite mass.  RECTAL ANOSCOPIC EXAM:  Skin tag about 1-inch away from the rectal os.  Rectal sphincter itself was somewhat decreased.  There were very small  hemorrhoids internally barely noticeable.  There was no prolapse of the  rectal mucosa, no evidence of proctitis.  Stool was formed and hemoccult  negative.  I could not appreciate any fissure.   IMPRESSION:  A 77 year old white female with intermittent painless  rectal bleeding in small amounts without resulting anemia.  She is on  Plavix and therefore high risk for conscious sedation.  She had two  previous colonoscopies, both confirmed presence of diverticulosis.  Her  risk for colon cancer is rather low.  I feel that her rectal bleeding  is  related to the anal canal to meticulous cleaning and irritation of skin  at the rectal orifice.  It would be highly unlikely if she had a lesion  higher up in her colon.   PLAN:  I would like her to stay on high fiber diet and on ProctoFoam as  prescribed by Dr. Tresa Res.  I have also given her samples of Analpram  cream to use p.r.n.  I advised her not to use harsh rubbing or cleaning  of the rectum since it may precipitate bleeding secondary to Plavix.  If  the bleeding continues despite of the conservative measure, I think we  should go ahead with colonoscopy.  She would normally have a repeat  colon exam in August, 2010.     Hedwig Morton. Juanda Chance, MD  Electronically Signed    DMB/MedQ  DD: 08/20/2006  DT: 08/20/2006  Job #: [id number]   cc:   Edwena Felty. Romine, M.D.  Celine Mans

## 2010-08-16 NOTE — Assessment & Plan Note (Signed)
[facility]                            CARDIOLOGY OFFICE NOTE   NAME:Garcialopez, DESTRY BEZDEK                    MRN:          [id number]  DATE:07/30/2007                            DOB:          [birth year]    PRIMARY CARE PHYSICIAN:  Dr. Celine Mans.   REASON FOR VISIT:  Routine follow-up.   HISTORY OF PRESENT ILLNESS:  Ms. Brave comes back in for a one-year  visit.  Her prior cardiac evaluation is outlined in my previous note  from April 2008.  Since I last saw, she states that she has been overall  doing fairly well.  She is not reporting any exertional chest pain or  limiting shortness of breath.  She has just undergone some recent  varicose vein laser treatment to her left leg.  She is still healing  from this.  She states that approximately two or three months ago she  was experiencing more pain in her legs and was taken off of Plavix and  Crestor to see if perhaps these were related.  Her symptoms are now  better although confounded by the fact that she is undergoing treatment  for varicose veins.  She states that she plans to see Dr. Royal Hawthorn and more  than likely be evaluated further with repeat lipids and determination of  which medication if any was involved.  Electrocardiogram today is normal  showing sinus rhythm at 82 beats per minute.  She states the only chest  pain she has experienced has been some brief sharp episodes pain that  have been sporadic and at rest few weeks ago.  This has not recurred.  She otherwise had no significant orthopnea, PND, or lower extremity  edema.   ALLERGIES:  SULFA DRUGS.   MEDICATIONS:  1. Calcium citrate 1000 mg p.o. daily.  2. Multivitamin 1 p.o. daily.  3. Metoprolol 25 mg p.o. b.i.d.  4. Aspirin 81 mg p.o. daily.  5. Omega 3 supplement 1200 mg p.o. daily.   REVIEW OF SYSTEMS:  As described in the history of present illness. All  else negative.   PHYSICAL EXAMINATION:  Blood pressure is 138/70,  heart rate is 88,  weight 123 pounds.  The patient is comfortable in no acute distress.  Examination of the neck reveals no elevated jugular venous pressure, no  loud bruits, no thyromegaly.  LUNGS:  Are clear without labored breathing.  CARDIAC EXAM:  Reveals a regular rate and rhythm.  No significant  systolic murmur or S3 gallop.  No pericardial rub.  ABDOMEN:  Soft.  No tenderness.  No bruits.  EXTREMITIES:  Exhibit no edema with compression hose in place.  Distal  pulses are 1+.   IMPRESSION/RECOMMENDATIONS:  1. History of cerebrovascular disease and mild hyperlipidemia with      overall reassuring cardiac evaluation based on exercise Myoview      from last year.  The patient has had sporadic atypical chest pain      but nothing progressive, and observation will be our plan at this      time.  She continues on aspirin and beta blocker.  It is not      entirely clear to me whether either the Plavix or the Crestor were      related to the patient's lower extremity discomfort given her      concurrent peripheral varicosities.  The peripheral varicosities      have now been treated.  I think one reasonable option might be to      rechallenge her perhaps one medicine at a time to see if she      manifests recurrent symptoms or not.  An alternate to Plavix and      aspirin might be Aggrenox from a neurological perspective.      Clearly, there are other statins that can be chosen if need be.  We      will plan to see her back in one year's time.     Jonelle Sidle, MD  Electronically Signed    SGM/MedQ  DD: 07/30/2007  DT: 07/30/2007  Job #: [id number]   cc:   Celine Mans

## 2010-08-16 NOTE — Assessment & Plan Note (Signed)
[facility]                             PULMONARY OFFICE NOTE   NAME:West, [name] CRAGO                    MRN:          [id number]  DATE:12/25/2006                            DOB:          1929-04-08    PROBLEM LIST:  1. Allergic rhinitis.  2. Allergic conjunctivitis.   HISTORY:  One year followup for this never smoker who says Nasonex was  working well until she ran out a few months ago.  She did not try to get  it refilled.  She complains of allergies all summer, meaning watering  eyes, nasal congestion, and a complaint that the skin of her face feels  dry.  She asks if I would refill skin meds that she had been provided in  the past by a dermatologist and uses sparingly for scaling and rash on  the face.  There have been no chest complaints, nothing purulent or  bloody.   MEDICATIONS:  1. Calcium.  2. Fosamax with vitamin D.  3. Vitamins.  4. Crestor 10 mg.  5. Plavix 75 mg.  6. Proctofoam 3 or 4 times daily.  7. Zyrtec p.r.n. use.  8. Nasonex p.r.n. use.   DRUG INTOLERANCE:  SULFA.   OBJECTIVE:  Weight 125 pounds, BP 130/84, pulse 78, room air saturation  100%.  Conjunctivae are clear.  Skin of the face is a bit dry with no evident rash.  Nasal mucosa is  atrophic.  LUNGS:  Clear.  Heart sounds normal.   IMPRESSION:  1. Rhinitis.  2. Allergic conjunctivitis.  3. Periorbital dermatitis.   PLAN:  1. Nasonex was refilled with discussion.  2. Filled Locoid 0.1% one tube, small amount to affected area daily      for 3 days, then switch to      Aclovate 0.05% cream, thin layer to affected area t.i.d. p.r.n.  3. Schedule return one year, earlier p.r.n.     Clinton D. Maple Hudson, MD, Tonny Bollman, FACP  Electronically Signed    CDY/MedQ  DD: 12/25/2006  DT: 12/26/2006  Job #: [id number]

## 2010-08-19 NOTE — H&P (Signed)
[name] West, [name] West NO.:  [id number]   MEDICAL RECORD NO.:  [id number]          PATIENT TYPE:  INP   LOCATION:  1843                         FACILITY:  [facility]   PHYSICIAN:  Christell Faith, MD   DATE OF BIRTH:  [birth year]   DATE OF ADMISSION:  06/17/2006  DATE OF DISCHARGE:                              HISTORY & PHYSICAL   PRIMARY CARE PHYSICIAN:  Dr. Royal Hawthorn in [place].   CHIEF COMPLAINT:  Left arm pain.   HISTORY OF PRESENT ILLNESS:  This is a 77 year old white female with no  previous cardiac history who was already awake at 4 a.m. this morning  when she felt numbness in her left hand followed by sharp left arm pain  radiating up into the axilla.  She did not have any chest pain,  shortness of breath, nausea, or diaphoresis.  This lasted a couple of  hours and concerned her, so she eventually made her way up to the [facility] Emergency Department.  Over the last couple of weeks, she has also  complained of a sharp pain between her shoulder blades worse with a deep  breath and with coughing.  This is fleeting and intermittent.  It does  not occur everyday.  She has had no chest pain, pressure, or discomfort  with exertion, no orthopnea, PND, no palpitations, no syncope or  presyncope.  She was recently worked up for blurry vision in her left  eye, and a head CT scan showed an old right basal ganglia lacunar  infarct.  She is very concerned about this stroke and was initially  concerned that this morning's episode represented a new stroke.  She has  had no new blurry vision, no garbled speech, no facial asymmetry, no  strength asymmetry, no gait disturbance.   REVIEW OF SYSTEMS:  Review of systems includes what is mentioned in the  HPI as well as anxiety and a couple of episodes of a very small amount  of bright red blood on the toilet paper with having a bowel movement.  No blood in the urine, no vaginal bleeding.  No coughing or wheezing, no  fevers,  chills, or sweats, no weight change.  The balance of ten systems  is reviewed and is negative.   PAST MEDICAL HISTORY:  1. Recurrent diverticulitis for which she sees Dr. Juanda Chance.  2. Remote history of hyponatremia for which she  follows with an      endocrinologist.  3. Right basal ganglia lacuna infarct incidentally diagnosed on head      CT scan.  4. Hypertension.  5. Hyperlipidemia.  6. History of appendectomy.  7. History of tonsillectomy.  8. She recently had an echocardiogram at [facility].  We do not      have the records.  9. She recently had carotid Dopplers at an outside facility which we      do not have a copy of.   SOCIAL HISTORY:  She lives in [place] with her husband.  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  She has never been a smoker.  She has an occasional glass of red wine.   FAMILY HISTORY:  Her mother died at age 42.  Sister died at age 12 of an  MI.   ALLERGIES:  SULFA.   MEDICATIONS:  1. Toprol XL 25 mg b.i.d.  2. Plavix 75 mg daily.  3. Crestor 10 mg daily.  4. A multivitamin.   PHYSICAL EXAMINATION:  Temperature 98.3, pulse 90, respiratory rate 18,  blood pressure initially 198/90, currently 165/80.  Saturation 100% on  room air.  GENERAL:  This is a thin elderly anxious white female in no distress.  HEENT:  Her pupils are round and reactive.  Sclerae are clear.  Head is  normocephalic and atraumatic.  Dentition is good.  Mucous membranes are  moist.  NECK:  Supple without lymphadenopathy, no JVD, on carotid bruits.  CARDIAC:  Regular rate and rhythm, normal S1, physiologically split S2,  no murmurs, rubs, or gallops.  Her peripheral pulses are 2+ and equal at  the radial and dorsalis pedis.  LUNGS:  Clear to auscultation bilaterally without wheezing, rhonchi, or  rales.  There is no tenderness to palpation along the spine.  SKIN:  No rash or lesions.  ABDOMEN:  Soft, nontender, nondistended with normal bowel sounds.   There  is no hepatosplenomegaly.  EXTREMITIES:  No clubbing, cyanosis, or edema.  NEUROLOGIC:  She is awake, alert, and oriented x3.  The face is  symmetric.  Speech is normal.  Extraocular movements are intact.  Tongue  is midline.  Jaw is midline.  Shoulder shrug is normal.  Hand grip is  normal bilaterally.  There is no pronator drift.  Reflexes are intact at  the knees and elbows bilaterally.  There is no numbness at this time in  either upper extremity.   Chest x-ray is normal with questionable hyperinflation.   Electrocardiogram reveals a rate of 90, normal sinus rhythm with no ST,  T wave, or ischemic changes.   LABORATORY DATA:  White count is 6.6, hematocrit 36.4, sodium 135,  potassium 4.9, BUN 8, creatinine 0.6, CK-MB < 1, troponin-I < 0.05,  myoglobin 38.   IMPRESSION:  A 77 year old white female with an atypical presentation  including sharp left arm pain and left hand numbness now resolved.  She  has a strong family history for cardiac disease and has hypertension.  1. Observe on telemetry.  2. Rule out myocardial infarction with serial EKGs and cardiac      enzymes.  3. Her TIMI risk score is very low, so we will hold anticoagulation      unless her status changes.  4. Will continue Plavix, Crestor, and Toprol.  5. Will follow her blood pressure closely and strongly consider adding      an angiotensin-converting inhibitor if her hypertension persists.  6. She probably needs an inpatient versus outpatient stress test.      Will make her n.p.o. after midnight for possible testing tomorrow.  7. Consider outpatient neurology workup of her left hand numbness.      Consider carpal tunnel syndrome.  8. Will check a fasting lipid panel and try to obtain results of a      recent echocardiogram.  9. Her deep venous thrombosis prophylaxis will be ambulation.      Christell Faith, MD  Electronically Signed    NDL/MEDQ  D:  06/17/2006  T:  06/17/2006  Job:  [id number]

## 2010-08-19 NOTE — Assessment & Plan Note (Signed)
[facility]                               PULMONARY OFFICE NOTE   NAME:Abdullah, [name] West                    MRN:          [id number]  DATE:01/05/2006                            DOB:          30-Dec-1929    PROBLEM:  Allergic rhinitis.   HISTORY:  In the last two months, she has had a thick post nasal drainage  and some watery anterior rhinorrhea, which she thinks would be controlled if  she just refilled her Nasonex and Zyrtec.  Her chest is comfortable with an  occasional minor cough for which she has started using Airborne.  Her  husband is ill and hospitalized at [facility] currently with an  unrelated problem.  She asked for flu shot.  She is not having trouble with  her eyes as she did in the spring.   MEDICATIONS:  1. Zyrtec.  2. Nasonex.   ALLERGIES:  Drug intolerance to SULFA.   OBJECTIVE:  VITAL SIGNS:  Weight 129 pounds, blood pressure 146/88, pulse  regular 78, room air saturation 100%.  HEENT:  Nasal mucosa is wet but not edematous.  There is no post nasal  drainage.  Palate length is 3/4 with no erythema, glandularity or obvious  drainage.  I find no cervical adenopathy.  There is no stridor.  Voice  quality is normal.  LUNGS:  Clear.  CARDIOVASCULAR:  Heart sounds regular without murmur.   IMPRESSION:  Nasal congestion with allergic rhinitis.  I suspect a little  anxiety component related to her husband's illness as well.   PLAN:  1. We have discussed and refilled Nasonex one spray each nostril daily and      Zyrtec 10 mg one daily p.r.n.  2. Flu vaccine discussed and given.  3. Schedule return in 12 months, earlier p.r.n.       Clinton D. Maple Hudson, MD, FCCP, FACP   CDY/MedQ  DD:  01/05/2006  DT:  01/08/2006  Job #:  [id number]

## 2010-08-19 NOTE — Discharge Summary (Signed)
[name], West NO.:  [id number]   MEDICAL RECORD NO.:  [id number]          PATIENT TYPE:  INP   LOCATION:  2033                         FACILITY:  [facility]   PHYSICIAN:  [name] Sidle, MD DATE OF BIRTH:  06/09/1929   DATE OF ADMISSION:  06/17/2006  DATE OF DISCHARGE:  06/18/2006                               DISCHARGE SUMMARY   PRIMARY CARDIOLOGIST:  Dr. Diona Browner.   PRIMARY CARE:  Dr. Royal Hawthorn in [place].   DISCHARGE DIAGNOSIS:  1. Left hand and arm pain, negative cardiac markers this admission.  2. Numbness in left hand/pain in left arm status post neurological      consultation this admission.  No acute findings.  The patient to be      followed up outpatient by [name] West, M.D.  Status post      MRI this admission, MRI of the head showing atrophy and chronic      small vessel ischemic changes.  No acute abnormality.   PAST MEDICAL HISTORY:  1. Includes remote diverticulitis followed by Dr. Lina Sar  2. Remote history of hyponatremia.  3. Right basal ganglia lacuna infarct incidentally diagnosed on head      CT scan previously.  4. Hypertension.  5. Hyperlipidemia.  6. History of appendectomy.  7. History of tonsillectomy.  8. Allergy to sulfa.  9. Recent initiation of statin by primary care physician.   West COURSE:  [name] West is a very pleasant 77 year old Caucasian  female with no previous cardiac history who woke on the morning of  admission complaining of numbness in her left hand followed by sharp  left arm pain radiating up to the axilla. Denied any chest discomfort at  that time, however symptoms lasted for a couple of hours so she  presented to [name] emergency room for further evaluation. Over the  last couple weeks the patient had noticed sharp pain between her  shoulder blades that is fleeting and intermittent. The patient also  apparently was recently worked up for blurry vision in her left eye and  a head  CT scan showed an old right basal ganglia lacunar infarct that  was a new diagnosis to the patient.   Initial exam revealed blood pressure was 198/90, 100% on room air,  afebrile, heart rate 90. Chest X-Ray: Normal.  EKG: normal sinus rhythm  without acute ST or T-wave changes.  Point of care enzymes were  negative.  H&H stable, hematocrit 36.4, BUN 8, creatinine 0.6.  The  patient was admitted for observation and we continued her Plavix,  Crestor, and Toprol for which the patient had already been started on by  her primary care physician.  We asked neurology to consult regarding  possible neurological symptoms.  She saw the patient in consultation on  the 17th and the patient also underwent an MRI of the head, results as  stated above. Dr. Orlin West did not feel there was an acute neurological  event occurring. She stated she would follow the patient up outpatient.  She agreed with continued to aspirin, did not feel that Plavix would be  reasonable to continue, however, if cardiac workup negative would not be  necessary. On day of discharge the patient without further symptoms,  vital signs stable.  The patient has maintained a normal sinus rhythm on  monitor.   LAB WORK AT TIME OF DISCHARGE:  Cardiac enzymes negative x3 sets. BUN  and creatinine 10 and 0.43, potassium 4.2. H&H 12.3 of 36.4.  The  patient is being discharged home with arrangements made to have an  outpatient exercise Myoview at our office. I have instructed the patient  to continue the Plavix 75 mg daily and aspirin 81 mg daily until she  follows up with Dr. Diona Browner post stress test.  She also will continue  the metoprolol 25 MHz daily.  She was asked about the vitamin she was  previously on. They are over-the-counter vitamin, I stated that would be  fine.  She is to follow up with Dr. Orlin West and she has left her office  number for the patient to call for appointment as it is after hours.  I  have given the patient  written instructions regarding her stress test  and I faxed the appropriate information to the office along with a  telephone call for office to call the patient at home with dates and  times for stress test and follow up with Dr. Diona Browner.  Duration of  discharge encounter is 45 minutes with paperwork.      [name] West, ACNP      [name] Sidle, MD  Electronically Signed    [name] West  D:  06/18/2006  T:  06/19/2006  Job:  [id number]   cc:   [name] West, M.D.  [name] West

## 2010-08-19 NOTE — Assessment & Plan Note (Signed)
[facility]                            CARDIOLOGY OFFICE NOTE   NAME:West West GAUGHRAN                    MRN:          [id number]  DATE:07/27/2006                            DOB:          [birth year]    PRIMARY CARE PHYSICIAN:  West Mans, MD.   REASON FOR VISIT:  Post hospitalization followup.   HISTORY OF PRESENT ILLNESS:  I saw West West during her hospital stay  in March.  She presented at that time with left arm discomfort that  seemed perhaps neuropathic in etiology.  She was evaluated from a  cardiac perspective with normal cardiac markers.  Dr. Orlin West saw her in  neurological consultation with subsequent workup.  She had a prior  history of apparent right basal ganglia lacunar infarct incidentally  noted on a CT scan of the head, and MRI scanning demonstrated atrophy  with chronic small vessel ischemic changes, but no acute abnormality.  She has been managed with the strategy of Plavix and aggressive lipid  control.   From a cardiac perspective, she was referred for a followup outpatient  exercise Myoview performed earlier this month.  This particular study  was normal revealing no significant electrocardiographic changes or  perfusion abnormality to suggest ischemia, and overall ejection fraction  of 79%.  I relayed this information to the patient and her husband  today, and she was quite reassured.  She has not had any subsequent left  arm discomfort.  She does mention to me parenthetically some episodes of  profound weakness that seem to happen sporadically.  She has had no  frank syncope and otherwise seems to tolerate activity well.  I spoke  with her today about the possibility of wearing an outpatient event  recorder to exclude any dysrhythmias, and she thought it would be most  appropriate simply to observe for the time being and perhaps evaluate  things further if her symptoms progress.  Her baseline electrocardiogram  today shows a sinus rhythm at 73 beats per minute with normal intervals.   ALLERGIES:  SULFA DRUGS.   PRESENT MEDICATIONS:  1. Calcium citrate 1000 mg p.o. t.i.d.  2. Multivitamin daily.  3. Vitamin E, B12, and C supplements.  4. Metoprolol 25 mg p.o. b.i.d.  5. Crestor 10 mg p.o. daily.  6. Plavix 75 mg p.o. daily.   REVIEW OF SYSTEMS:  As described in the History of Present Illness.   PHYSICAL EXAMINATION:  VITAL SIGNS:  Blood pressure is 130/80, heart  rate is 73, weight is 127 pounds.  GENERAL:  The patient is comfortable and in no acute distress.  NECK:  No elevated jugulovenous pressure without bruits.  No thyromegaly  is noted.  LUNGS:  Clear without labored breathing at rest.  CARDIAC:  Regular rate and rhythm without loud murmur or S3 gallop.  EXTREMITIES:  No pitting edema.  Distal pulses are 2+.  SKIN:  Warm and dry.   IMPRESSION AND RECOMMENDATIONS:  1. Reassuring cardiac evaluation, including recent exercise Myoview      demonstrating no ischemia with good left ventricular function.  The  patient's previous left arm pain seems most likely noncardiac in      etiology.  These symptoms have completely resolved.  2. History of intermittent sense of weakness and fatigue.  She has had      no frank dizziness or syncope and denies any palpitations.  I asked      her to be particularly observant for any progression in these      symptoms and if so we may consider an outpatient event recorder to      exclude any dysrhythmias.  She was comfortable with observation for      the time being.  3. Otherwise continue other followup with Dr. Royal West.  I will plan to      see her back in one year's time for symptom review.     West Sidle, MD  Electronically Signed    SGM/MedQ  DD: 07/27/2006  DT: 07/27/2006  Job #: [id number]   cc:   West West West West, M.D.

## 2010-08-19 NOTE — Consult Note (Signed)
NAMEABRIE, EGLOFF NO.:  [id number]   MEDICAL RECORD NO.:  [id number]          PATIENT TYPE:  INP   LOCATION:  2033                         FACILITY:  [facility]   PHYSICIAN:  Gustavus Messing. Orlin Hilding, M.D.DATE OF BIRTH:  Aug 07, 1929   DATE OF CONSULTATION:  06/18/2006  DATE OF DISCHARGE:                                 CONSULTATION   NEUROLOGY STROKE CONSULT:   CHIEF COMPLAINT:  Left arm pain and numbness; requested to rule out  stroke.   HISTORY OF PRESENT ILLNESS:  Ms. Delconte is a 77 year old right-handed  white woman with a history of hypertension.  She says that 3 months ago  she was experiencing some unilateral left eye symptoms described as  flashing colored light.  She is quite sure it was the left eye alone and  not left visual fields because she ultimately covered one eye and  discovered this.  She had an ophthalmologic workup, which was negative  and her primary physician initiated a stroke workup, which included a CT  scan of the brain, carotid Doppler and 2D echo.  The CT report came back  described as showing a remote, large right basal ganglia infarct, as  well as small vessel disease, which appeared chronic.  No acute lesions  were described.  She has had this carotid Doppler and the 2D echo, but  the results are pending.  Patient was unable to sleep Friday night  because of anxiety regarding this and around 4 a.m. Saturday had onset  of excruciating sharp pain in her left hand and thumb, which radiated up  to the axilla.  That pain lasted about 10 minutes.  She has had milder  intermittent left thumb pain and left hand numbness for several weeks  and that persists.   REVIEW OF SYSTEMS:  She complains of some pain between her shoulder  blades and pain behind the left knee.  She had no significant headache.  No diplopia.  No facial numbness or weakness.  No nausea or vomiting.  No speech or language problems.  No swallowing problems.  A 12-system  review was performed, including cardiovascular, pulmonary, neurologic,  hematologic, endocrine, GI, GU, musculoskeletal, ENT, reproductive,  skin, mucosa, pain, sleep and nutrition and is positive only for the  following:  Extremity weakness and numbness on the left that has already  been described.  It is really numbness, not weakness.  Chronic  lumbosacral degenerative disease, itching, redness in the right  antecubital region and insomnia.   PAST MEDICAL HISTORY:  Significant for:  1. Hypertension.  2. Newly diagnosed hyperlipidemia.  3. Remote tonsillectomy.  4. Remote appendectomy.  5. No diabetes.  6. No coronary artery disease.  7. She does have a history of diverticulitis.  8. History of remote hypernatremia.  9. She has cerebrovascular disease by CT, which is asymptomatic.   MEDICATIONS:  1. Toprol-XL 25 mg b.i.d.  2. Plavix 75 mg once a day, which is recent.  3. Crestor 10 mg once a day, which is recent.  4. Multivitamin once a day.   ALLERGIES:  SULFA.   SOCIAL HISTORY:  No  cigarette use.  Occasional alcohol.  She is married.  Retired as a Investment banker, corporate.   FAMILY HISTORY:  Positive strongly for coronary artery disease.   PHYSICAL EXAMINATION:  VITAL SIGNS:  Temperature is 97.6.  Pulse 60.  Respirations 18.  BP 134/734, 96% saturation on room air.  GENERAL EXAM:  Head is normocephalic, atraumatic.  NECK:  Supple.  There is a very quiet left bruit, low down.  HEART:  Regular rate and rhythm.  LUNGS:  Clear to auscultation.  EXTREMITIES:  Without edema.  NEUROLOGIC EXAM:  She scored a 0 on the NIH stroke scale.  Items 1A, B  and C.  She is keenly alert, oriented and follows commands, gets a 0.  Items 2, 3 and 4 normal.  Extraocular movements normal.  Visual fields  normal.  Facial activity gets 0.  Items 5A and B, 6A and B no drift in  upper or lower extremities with a 0/7.  No ataxia and gets a 0.  Item 8,  no sensory change, gets a 0.  Items 9 and 10, no  aphasia or dysarthria,  gets a 0.  Item 11, no extension, gets a 0.  Mental status:  She is  awake and alert, fully oriented with normal language and cognition.  Cranial nerves:  Pupils are equal and reactive.  Visual fields are full.  Extraocular movements are intact.  Facial sensation is normal.  Facial  motor activity is intact.  Hearing is intact.  Mouth is symmetric and  tongue is midline.  Motor exam:  There is no drift.  She has no 5/5  strength with normal bulk and tone throughout.  No drift or satelliting.  Normal rapid fine movements.  No fasciculations, atrophy, or tremor.  Deep tendon reflexes are 2+ and symmetric with downgoing toes.  Coordination:  Finger-to-nose, rapid alternating movements and heel-to-  shin are normal.  Did not have the patient ambulate.  Sensory exam is  intact to light touch, temperature, and vibration:  There is no  extinction to double simultaneous stimulation.   Her CT was done remotely on the 8th of March and did show a remote  large right basal ganglia infarct.  I do not have that right now.  She  is pending an MRI.  A 2D echo and carotid Doppler are also pending.   LABS:  A white blood cell count 6.6, hemoglobin 12.3, hematocrit 36.4,  platelets 289.  Sodium 135, potassium 4.9, chloride 101, CO2 32, BUN 8,  creatinine 0.6, glucose 106.  She has a positive Tinel's sign on the  left upper extremity at the wrist.   IMPRESSION:  1. Left hand and arm pain and numbness.  2. Cerebrovascular disease by CT apparently asymptomatic.   I do not think that her current event of left hand and arm pain  represent stroke.  Pain is unusual for a stroke presentation.  The  isolated numbness in the hand is more suggestive of focal process, such  as a radiculopathy or neuropathy.  She does have a positive Tinel's and  pain between her shoulder blades, which would imply carpal tunnel syndrome or cervical radiculopathy.  However, she does have evidence of   cerebrovascular disease by CT report with a right basal ganglia infarct,  which is likely asymptomatic and is not likely to be related to her left  eye symptom that she had in January and February and is not likely to be  related to the pain in the  left arm that she had the night before last.   RECOMMENDATIONS:  Agree with MRI scan of the brain and check on previous  outpatient workup, carotid Doppler and 2D echo.  If the right basal  ganglia infarct appears more acute, Plavix may be reasonable to  continue.  If negative, I would not continue it unless cardiac reason.  Would  increase the aspirin from 81 to 325 mg, however.  As an outpatient, we  can obtain nerve conduction EMG of the left upper extremity and, if  indicated, an MRI of the cervical spine.      Catherine A. Orlin Hilding, M.D.  Electronically Signed     CAW/MEDQ  D:  06/18/2006  T:  06/18/2006  Job:  [id number]

## 2010-09-09 ENCOUNTER — Telehealth: Payer: Self-pay | Admitting: Internal Medicine

## 2010-09-09 ENCOUNTER — Other Ambulatory Visit: Payer: Self-pay | Admitting: Internal Medicine

## 2010-09-09 NOTE — Telephone Encounter (Signed)
OK to refill Belladonna, as per prior prescription, 2 refills.

## 2010-09-09 NOTE — Telephone Encounter (Signed)
Spoke with pt.  She is c/o "slight cough"- non prod and runny nose. She states drainage from nose is clear. Per last ov note, he rec that she try allegra otc to see if this helps. Pt states that she has not tried this yet. I advised that she should try this over the w/e and give [place] a call next wk if not improving. Pt agrees and will let us know if not improving with this.

## 2010-09-09 NOTE — Telephone Encounter (Signed)
Spoke with patient and she states she needs another prescription for [name] West to relax her colon. She states Dr. Juanda Chance has given it to her in the past after her procedure. I told her that I can see she has given it to her in the past but will need to ask Dr. Juanda Chance if this ok to send to her pharmacy. Pt agreed and I told her that we will call her Monday.

## 2010-09-09 NOTE — Telephone Encounter (Signed)
LMTCB

## 2010-09-09 NOTE — Telephone Encounter (Signed)
Left a message for patient to return my call. 

## 2010-09-12 MED ORDER — BELLADONNA ALK-PHENOBARBITAL 16.2 MG PO TABS: 1.0000 | ORAL_TABLET | Freq: Two times a day (BID) | ORAL | Status: DC | PRN

## 2010-09-12 NOTE — Telephone Encounter (Signed)
Rx sent to patient's pharmacy. Patient advised.

## 2010-09-14 ENCOUNTER — Ambulatory Visit: Payer: Self-pay | Admitting: Internal Medicine

## 2010-10-03 ENCOUNTER — Ambulatory Visit: Payer: Self-pay

## 2010-10-07 ENCOUNTER — Ambulatory Visit: Payer: Medicare Other | Admitting: Internal Medicine

## 2010-10-12 ENCOUNTER — Telehealth: Payer: Self-pay | Admitting: Internal Medicine

## 2010-10-12 NOTE — Telephone Encounter (Signed)
Spoke with pt and sched her to see CDY for 9 am tomorrow.

## 2010-10-13 ENCOUNTER — Ambulatory Visit (INDEPENDENT_AMBULATORY_CARE_PROVIDER_SITE_OTHER): Payer: Medicare Other | Admitting: Internal Medicine

## 2010-10-13 ENCOUNTER — Encounter: Payer: Self-pay | Admitting: Internal Medicine

## 2010-10-13 VITALS — BP 138/80 | HR 63 | Ht 62.5 in | Wt 116.6 lb

## 2010-10-13 DIAGNOSIS — J309 Allergic rhinitis, unspecified: Secondary | ICD-10-CM

## 2010-10-13 MED ORDER — MOMETASONE FUROATE 50 MCG/ACT NA SUSP: 2.0000 | Freq: Every day | NASAL | Status: DC

## 2010-10-13 NOTE — Progress Notes (Signed)
Subjective:    Patient ID: [name] West, female    DOB: Jun 14, 1929, 77 y.o.   MRN: [id number]  HPI    Review of Systems     Objective:   Physical Exam        Assessment & Plan:   Subjective:    Patient ID: [name] West, female    DOB: 12-18-1929, 77 y.o.   MRN: [id number]  HPI 08/05/10- 13 yoF followed for allergic rhinitis, complicated by hypertension and hx of CVA.here with daughter.  Last here- September 16, 2010.  Now says 3 months of increased watery eyes and nose. Refilled Nasonex but it hasn't helped. Scant yellow but no evident infection. Off and on for 2-3 days she has had chest pressure, especially yesterday. Denies pain, palpitation, nausea or sweats. Some smothered supine. Dry cough this week, maybe with some wheeze.  Denies choke or strangle with food or drink. Mouth feels dry despite rhinorhea.  Another daughter had died in [place]- subsequently BP was very high till treated at [facility]. At [facility] last week- claritin didn't seem to help watery eyes/ nose at all.   10/13/10- 80 yoF never smoker followed for allergic rhinitis, complicated by hypertension and hx of CVA C/O watery eyes- worst last week. Ongoing 2-3 weeks. Using phenylephrine otc nose drops since nasonex ran out. Lower right lid feels irritated. Throat pressure / choking sensation.when she lies down x past few months. Ok upright and swallowing, with no change in her voice.  Her PCP xray'ed her neck and told her 2 vertebrae were pressing down. Not coughing much unless she notes postnasal drip.  She had tried Careers adviser and asks about taking it more regularly.   Review of Systems See HPI Constitutional:   No weight loss, night sweats,  Fevers, chills, fatigue, lassitude. HEENT:   No headaches,  Difficulty swallowing,  Tooth/dental problems,  Sore throat,               CV:  No chest pain,  Orthopnea, PND, swelling in lower extremities, anasarca, dizziness, palpitations  GI  No heartburn, indigestion,  abdominal pain, nausea, vomiting, diarrhea, change in bowel habits, loss of appetite  Resp: No excess mucus, No coughing up of blood.  No change in color of mucus.  No wheezing.  Skin: no rash or lesions.  GU: no dysuria, change in color of urine, no urgency or frequency.  No flank pain.  MS:  No joint pain or swelling.  No decreased range of motion.  No back pain.  Psych:  No change in mood or affect. No depression or anxiety.  No memory loss.      Objective:   Physical Exam General- Alert, Oriented, Affect-appropriate, Distress- none acute     Well-appearing , calm comfortable elderly woman  Skin- rash-none, lesions- none, excoriation- none  Lymphadenopathy- none  Head- atraumatic  Eyes- Gross vision intact, PERRLA, conjunctivae clear secretions  I see no injection or obvious irritation on eyes or lids  Ears- Normal- Hearing, canals, Tm  Nose- Clear,  No-Septal dev, mucus, polyps, erosion, perforation   Throat- Mallampati II , mucosa clear , drainage- none, tonsils- atrophic  Neck- flexible , trachea midline, no stridor , thyroid nl, carotid no bruit  Chest - symmetrical excursion , unlabored     Heart/CV- RRR , no murmur , no gallop  , no rub, nl s1 s2                     -  JVD- none , edema- none, stasis changes- none, varices- none     Lung- clear to P&A, wheeze- none, cough- none , dullness-none, rub- none   Unlabored with no cough at all while here     Chest wall-   Abd- tender-no, distended-no, bowel sounds-present, HSM- no  Br/ Gen/ Rectal- Not done, not indicated  Extrem- cyanosis- none, clubbing, none, atrophy- none, strength- nl  Neuro- grossly intact to observation         Assessment & Plan:

## 2010-10-13 NOTE — Patient Instructions (Signed)
Be careful not to use Equate or other otc nasal decongestant sprays regularly. Nasonex refill script sent.  Sample Pataday eye drop-   1 drop in each eye, once daily as needed  You could also try otc artificial tears or an otc allergy eye drop like Visine AC  Try elevating the head end of your bed frame the height of one brick. See if that eases the pressure sensation in your throat.

## 2010-10-13 NOTE — Assessment & Plan Note (Signed)
Rhinitis and conjunctivitis- more likely from dryness and poor air quality than from allergy now. I will give sample of Pataday for trial, but she may be more successfull with artificial tears,. We will restart nasonex and discourage overuse of allegra. Her cervical spine is likely cause of supine pressure sense. She will do what she can with pillows and it might help her tro elevated head of bed.

## 2010-12-22 ENCOUNTER — Ambulatory Visit
Admission: RE | Admit: 2010-12-22 | Discharge: 2010-12-22 | Disposition: A | Discharge status: Home / Self Care | Payer: Medicare Other | Source: Ambulatory Visit | Attending: Obstetrics and Gynecology | Admitting: Obstetrics and Gynecology

## 2010-12-22 DIAGNOSIS — Z1231 Encounter for screening mammogram for malignant neoplasm of breast: Secondary | ICD-10-CM

## 2011-01-09 ENCOUNTER — Telehealth: Payer: Self-pay | Admitting: Internal Medicine

## 2011-01-09 MED ORDER — AZELASTINE HCL 0.15 % NA SOLN: NASAL | Status: DC

## 2011-01-09 NOTE — Telephone Encounter (Signed)
Per CY--ok for astepro  #1  1-2 puffs each nostril up to twice daily as needed  Refill prn.  lmom to make pt aware.

## 2011-01-09 NOTE — Telephone Encounter (Signed)
Dr Maple Hudson, are you okay with me sending Astepro rx-I do not see this on her med list-only Nasonex. Thanks.

## 2011-02-06 ENCOUNTER — Telehealth: Payer: Self-pay | Admitting: Internal Medicine

## 2011-02-06 NOTE — Telephone Encounter (Signed)
Pt states she's calling Verlon Au back can be reached at [phone].Raylene Everts

## 2011-02-06 NOTE — Telephone Encounter (Signed)
Spoke with pt's spouse and she states pt can be here at 9:45 tomorrow. Appt was sched and I advised that pt seek emergency care in the meantime if needed.

## 2011-02-06 NOTE — Telephone Encounter (Signed)
Called and spoke with pt. I advised that there are no openings in CDY's schedule for 02/16/11 and so pt can not be seen at the same time as her. I asked what he needed to be seen for and she states that he is having increased SOB for quite sometime and needs appt asap. I advised that there are some blocked spots for tomorrow that we could use to see him (okay per Florentina Addison). She states that he is not home at this time and she will try to call him and see if he could come in tomorrow and then call us back. Will await a call back.

## 2011-02-16 ENCOUNTER — Ambulatory Visit: Payer: Medicare Other | Admitting: Internal Medicine

## 2011-03-21 DIAGNOSIS — E871 Hypo-osmolality and hyponatremia: Secondary | ICD-10-CM | POA: Insufficient documentation

## 2011-03-21 DIAGNOSIS — G479 Sleep disorder, unspecified: Secondary | ICD-10-CM

## 2011-03-21 HISTORY — DX: Sleep disorder, unspecified: G47.9

## 2012-03-06 ENCOUNTER — Other Ambulatory Visit: Payer: Self-pay | Admitting: Obstetrics and Gynecology

## 2012-03-06 DIAGNOSIS — Z1231 Encounter for screening mammogram for malignant neoplasm of breast: Secondary | ICD-10-CM

## 2012-03-19 ENCOUNTER — Ambulatory Visit
Admission: RE | Admit: 2012-03-19 | Discharge: 2012-03-19 | Disposition: A | Discharge status: Home / Self Care | Payer: Medicare Other | Source: Ambulatory Visit | Attending: Obstetrics and Gynecology | Admitting: Obstetrics and Gynecology

## 2012-03-19 DIAGNOSIS — Z1231 Encounter for screening mammogram for malignant neoplasm of breast: Secondary | ICD-10-CM

## 2012-04-11 ENCOUNTER — Other Ambulatory Visit: Payer: Self-pay | Admitting: Internal Medicine

## 2012-04-11 NOTE — Telephone Encounter (Signed)
astepro 0.15%nasal spary (200 dose)<> use 1-2 puffs in each nostril up to twice daily as needed.  Allergies  Allergen Reactions  . Sulfonamide Derivatives    No ov scheduled  Dr Maple Hudson please advise.  Thank you

## 2012-04-12 MED ORDER — AZELASTINE HCL 0.15 % NA SOLN: NASAL | Status: DC

## 2012-04-12 NOTE — Telephone Encounter (Signed)
Per CY-okay to refill prn. Rx sent.

## 2012-06-14 ENCOUNTER — Telehealth: Payer: Self-pay | Admitting: Internal Medicine

## 2012-06-14 NOTE — Telephone Encounter (Signed)
Called and spoke with pt and she stated that for the last 2-3 weeks she has been having these blood clots in her nose.  She stated that she took a small flashlight and looked up in her nose and there were white clusters of gummy like material that looks like spider webs in her nose.  She used a q tip to clean this out.  Her nose is now very sensitive on the inside and she has not been using her nasal spray.  Pt wanted to know CY recs.  Please advise. Thanks  Allergies  Allergen Reactions  . Sulfonamide Derivatives     Last ov--03/19/2012 Next ov--10/07/2012

## 2012-06-14 NOTE — Telephone Encounter (Signed)
lmomtcb on pt's home # Called # listed as her cell # - went directly to VM.  lmomtcb on this as well

## 2012-06-14 NOTE — Telephone Encounter (Signed)
Per Cy use saline nasal gel or nasal spray this weekend. 2-3 times daily.

## 2012-06-14 NOTE — Telephone Encounter (Signed)
Pt aware  Of CY recs nothing further needed.

## 2012-06-17 ENCOUNTER — Telehealth: Payer: Self-pay | Admitting: Internal Medicine

## 2012-06-17 NOTE — Telephone Encounter (Signed)
Pt wanted to make sure it was ok to use [place] Nasal spray.  PT instructed that this would be fine to use.  Pt may try saline gel if this doesn't work.

## 2012-07-31 ENCOUNTER — Telehealth: Payer: Self-pay | Admitting: Neurology

## 2012-08-01 NOTE — Telephone Encounter (Signed)
I called pt and relayed that she will need to see her pcp about the case of shingles she has now.  She has used lyrica and lidoderm patches in the past, also was asking about creme.   I told her if not generic creme, this could be a sample from doctors office or she may call manufacturers # from website.  Cost is expensive.   She verbalized understanding.

## 2012-08-06 ENCOUNTER — Other Ambulatory Visit (HOSPITAL_COMMUNITY): Payer: Self-pay | Admitting: Interventional Radiology

## 2012-08-06 DIAGNOSIS — I83892 Varicose veins of left lower extremities with other complications: Secondary | ICD-10-CM

## 2012-08-27 ENCOUNTER — Other Ambulatory Visit: Payer: Medicare Other

## 2012-09-03 ENCOUNTER — Ambulatory Visit
Admission: RE | Admit: 2012-09-03 | Discharge: 2012-09-03 | Disposition: A | Discharge status: Home / Self Care | Payer: Medicare Other | Source: Ambulatory Visit | Attending: Interventional Radiology | Admitting: Interventional Radiology

## 2012-09-03 DIAGNOSIS — I83892 Varicose veins of left lower extremities with other complications: Secondary | ICD-10-CM

## 2012-09-24 ENCOUNTER — Telehealth: Payer: Self-pay | Admitting: Emergency Medicine

## 2012-09-24 ENCOUNTER — Other Ambulatory Visit (HOSPITAL_COMMUNITY): Payer: Self-pay | Admitting: Interventional Radiology

## 2012-09-24 DIAGNOSIS — I83812 Varicose veins of left lower extremities with pain: Secondary | ICD-10-CM

## 2012-09-24 NOTE — Telephone Encounter (Signed)
LM FOR PT TO MAKE HER AWARE THAT SHE IS APPROVED FOR THE EVLT PROCEDURE.

## 2012-09-27 ENCOUNTER — Encounter: Payer: Self-pay | Admitting: *Deleted

## 2012-09-30 ENCOUNTER — Encounter: Payer: Self-pay | Admitting: Emergency Medicine

## 2012-09-30 NOTE — Telephone Encounter (Signed)
09-30-12- Pt called back to set up date/time for EVLT of LLE.  Pt is also on Plavix/ASA- will call DR [facility] office to get ok to stop both meds for procedure.  16:47-called Dr Dulce Sellar office- Claris Gower said to fax over something on letter head stating the procedure and she needs to stop the Plavix and ASA. Fax # [phone] Faxed note, waiting response.  Pt did schedule her Tx for 10-23-12.- mailed pt instructions.

## 2012-10-07 ENCOUNTER — Encounter: Payer: Self-pay | Admitting: Nurse Practitioner

## 2012-10-07 ENCOUNTER — Ambulatory Visit (INDEPENDENT_AMBULATORY_CARE_PROVIDER_SITE_OTHER): Payer: Medicare Other | Admitting: Nurse Practitioner

## 2012-10-07 VITALS — BP 136/50 | HR 64 | Resp 12 | Ht 62.0 in | Wt 114.4 lb

## 2012-10-07 DIAGNOSIS — E559 Vitamin D deficiency, unspecified: Secondary | ICD-10-CM

## 2012-10-07 DIAGNOSIS — Z01419 Encounter for gynecological examination (general) (routine) without abnormal findings: Secondary | ICD-10-CM

## 2012-10-07 MED ORDER — DESOXIMETASONE 0.25 % EX CREA: TOPICAL_CREAM | Freq: Two times a day (BID) | CUTANEOUS | Status: DC

## 2012-10-07 MED ORDER — VITAMIN D (ERGOCALCIFEROL) 1.25 MG (50000 UNIT) PO CAPS: 50000.0000 [IU] | ORAL_CAPSULE | ORAL | Status: DC

## 2012-10-07 NOTE — Patient Instructions (Addendum)

## 2012-10-07 NOTE — Progress Notes (Signed)
79 y.o. G2P2 Married Caucasian Fe here for annual exam.  She has had recent hospital stay for a day and half because of a pressure pain in upper abdomen.  All cardiac evaluation was normal and is waiting to see the GI. She feels well otherwise. Leonette Most, husband still is supportive and tries to help in any way. Since seeing specialist at [facility] her BP has been very good without so many ER visits. Anal fissure only occasional flare.  No LMP recorded. Patient is postmenopausal.          Sexually active: yes  The current method of family planning is post menopausal status.    Exercising: no  The patient does not participate in regular exercise at present. Smoker:  no  Health Maintenance: Pap:  09/30/2009  negative MMG:  03/19/2012 Colonoscopy:  05/2008 diverticulitis recheck in 10 years. BMD:   05/17/2011 T Score: total left femur -2.4, left forearm -4.6 on Prolia TDaP:  PCP maintains tetanus  Labs: PCP maintains lab (blood) work and urine.    reports that she has never smoked. She has never used smokeless tobacco. She reports that she drinks about 0.6 ounces of alcohol per week. She reports that she does not use illicit drugs.  Past Medical History  Diagnosis Date  . Diverticulosis of colon (without mention of hemorrhage)   . Irritable bowel syndrome   . Cerebrovascular disease, unspecified   . Other and unspecified hyperlipidemia   . History of shingles   . Allergic rhinitis   . Hypertension   . Stroke     Hx of strokes  . Asthma   . Diverticulosis   . Interstitial cystitis     Past Surgical History  Procedure Laterality Date  . Appendectomy    . Tonsillectomy      Current Outpatient Prescriptions  Medication Sig Dispense Refill  . aspirin 81 MG tablet Take 81 mg by mouth daily.        . Azelastine HCl 0.15 % SOLN 1-2 puffs each nostril up to twice daily as needed  1 mL  11  . calcium citrate-vitamin D (CITRACAL+D) 315-200 MG-UNIT per tablet Take 1 tablet by mouth daily.         . clopidogrel (PLAVIX) 75 MG tablet Take 75 mg by mouth daily.        Marland Kitchen denosumab (PROLIA) 60 MG/ML SOLN injection Inject 60 mg into the skin every 6 (six) months. Administer in upper arm, thigh, or abdomen      . diltiazem (CARDIZEM CD) 180 MG 24 hr capsule Take 180 mg by mouth daily.        . enalapril (VASOTEC) 20 MG tablet Take 20 mg by mouth 2 (two) times daily.        . metoprolol (LOPRESSOR) 50 MG tablet Take 2 by mouth every morning and 2 by mouth at bedtime      . Multiple Vitamin (MULTIVITAMIN) tablet Take 1 tablet by mouth daily.        . vitamin B-12 (CYANOCOBALAMIN) 500 MCG tablet Take 500 mcg by mouth daily.        . Vitamin D, Ergocalciferol, (DRISDOL) 50000 UNITS CAPS        No current facility-administered medications for this visit.    Family History  Problem Relation Age of Onset  . Heart disease Father   . Heart disease Sister   . 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  Otherwise, a comprehensive ROS was negative.  Exam:   BP 136/50  Pulse 64  Resp 12  Ht 5\' 2"  (1.575 m)  Wt 114 lb 6.4 oz (51.891 kg)  BMI 20.92 kg/m2 Height: 5\' 2"  (157.5 cm)  Ht Readings from Last 3 Encounters:  10/07/12 5\' 2"  (1.575 m)  10/13/10 5' 2.5" (1.588 m)  08/05/10 5' 2.5" (1.588 m)    General appearance: alert, cooperative and appears stated age Head: Normocephalic, without obvious abnormality, atraumatic Neck: no adenopathy, supple, symmetrical, trachea midline and thyroid normal to inspection and palpation Lungs: clear to auscultation bilaterally Breasts: normal appearance, no masses or tenderness Heart: regular rate and rhythm Abdomen: soft, non-tender; no masses,  no organomegaly Extremities: extremities normal, atraumatic, no cyanosis or edema Skin: Skin color, texture, turgor normal. No rashes or lesions Lymph nodes: Cervical, supraclavicular, and axillary nodes normal. No  abnormal inguinal nodes palpated Neurologic: Grossly normal   Pelvic: External genitalia:  no lesions              Urethra:  normal appearing urethra with no masses, tenderness or lesions              Bartholin's and Skene's: normal                 Vagina: normal appearing vagina with normal color and discharge, no lesions              Cervix: anteverted              Pap taken: no Bimanual Exam:  Uterus:  normal size, contour, position, consistency, mobility, non-tender              Adnexa: no mass, fullness, tenderness               Rectovaginal: Confirms               Anus:  normal sphincter tone, no lesions  A:  Well Woman with normal exam  Vit d Deficiency  Osteoporosis on Prolia  Anal fissure uses topical steroid only prn  On Plavix for CVA 3/08   History of uncontrolled HTN - better currently     P:   Pap smear as per guidelines   Refill Vit D for 1 year - will call back with results that was done at PCP  mammogram return annually or prn  An After Visit Summary was printed and given to the patient.

## 2012-10-08 NOTE — Progress Notes (Signed)
Encounter reviewed by Dr. Mehar Kirkwood Silva.  

## 2012-10-23 ENCOUNTER — Ambulatory Visit
Admission: RE | Admit: 2012-10-23 | Discharge: 2012-10-23 | Disposition: A | Discharge status: Home / Self Care | Payer: Medicare Other | Source: Ambulatory Visit | Attending: Interventional Radiology | Admitting: Interventional Radiology

## 2012-10-23 ENCOUNTER — Other Ambulatory Visit (HOSPITAL_COMMUNITY): Payer: Self-pay | Admitting: Interventional Radiology

## 2012-10-23 VITALS — BP 138/70 | HR 63 | Temp 98.1°F | Resp 15 | Ht 62.5 in | Wt 115.0 lb

## 2012-10-23 DIAGNOSIS — I83812 Varicose veins of left lower extremities with pain: Secondary | ICD-10-CM

## 2012-10-23 DIAGNOSIS — I83893 Varicose veins of bilateral lower extremities with other complications: Secondary | ICD-10-CM

## 2012-10-23 MED ORDER — DIAZEPAM 5 MG PO TABS: 10.0000 mg | ORAL_TABLET | Freq: Once | ORAL | Status: DC

## 2012-10-23 MED ORDER — DIAZEPAM 5 MG PO TABS: 5.0000 mg | ORAL_TABLET | Freq: Once | ORAL | Status: DC

## 2012-10-23 NOTE — Progress Notes (Signed)
1230  Informed Consent obtained.  Patient given verbal & written discharge instructions & states that she understands.  1245  Valium 10 mg po per Dr Fredia Sorrow.  1250  IV started Left forearm (x 1 attempt) with 22 gauge x 1" insyte angiocath.  Flushed with 10 mL NSS without difficulty.  Site unremarkable.  1320  Procedure started.  1510  Procedure ended.  Patient tolerated well.  1525  Thigh high graduated compression garment, 20-30 mm Hg, on LLE.  1530  IV discontinued, catheter intact.  Site unremarkable.  1540  Ambulated x 10 minutes.  Gait steady.  Tolerated well.  1550  Review discharge instructions with patient and husband.  Both state that they understand.  Dr Fredia Sorrow also discussed post procedure instructions with patient and husband.  1555 Discharged to home.  Husband to drive.  Iseah Plouff Carmell Austria, RN 10/23/2012 4:12 PM

## 2012-10-30 ENCOUNTER — Ambulatory Visit
Admission: RE | Admit: 2012-10-30 | Discharge: 2012-10-30 | Disposition: A | Discharge status: Home / Self Care | Payer: Medicare Other | Source: Ambulatory Visit | Attending: Interventional Radiology | Admitting: Interventional Radiology

## 2012-10-30 DIAGNOSIS — I83812 Varicose veins of left lower extremities with pain: Secondary | ICD-10-CM

## 2012-11-26 ENCOUNTER — Ambulatory Visit
Admission: RE | Admit: 2012-11-26 | Discharge: 2012-11-26 | Disposition: A | Discharge status: Home / Self Care | Payer: Medicare Other | Source: Ambulatory Visit | Attending: Interventional Radiology | Admitting: Interventional Radiology

## 2012-11-26 DIAGNOSIS — I83812 Varicose veins of left lower extremities with pain: Secondary | ICD-10-CM

## 2012-12-06 ENCOUNTER — Encounter: Payer: Self-pay | Admitting: *Deleted

## 2012-12-06 ENCOUNTER — Telehealth: Payer: Self-pay | Admitting: Internal Medicine

## 2012-12-06 NOTE — Telephone Encounter (Signed)
Patient is scheduling an OV per her husband.

## 2012-12-06 NOTE — Telephone Encounter (Signed)
Unfortunately, we are unable to send any medication to patient's pharmacy. She has not been seen in over 4 years. She will need an office visit if

## 2012-12-06 NOTE — Telephone Encounter (Signed)
Patient is scheduled for office visit with Dr Juanda Chance on 12/17/12 @ 2 pm.

## 2012-12-17 ENCOUNTER — Ambulatory Visit (INDEPENDENT_AMBULATORY_CARE_PROVIDER_SITE_OTHER): Payer: Medicare Other | Admitting: Internal Medicine

## 2012-12-17 ENCOUNTER — Encounter: Payer: Self-pay | Admitting: Internal Medicine

## 2012-12-17 VITALS — BP 138/86 | HR 68 | Ht 61.75 in

## 2012-12-17 DIAGNOSIS — K589 Irritable bowel syndrome without diarrhea: Secondary | ICD-10-CM

## 2012-12-17 DIAGNOSIS — K5730 Diverticulosis of large intestine without perforation or abscess without bleeding: Secondary | ICD-10-CM

## 2012-12-17 MED ORDER — BELLADONNA ALK-PHENOBARBITAL 16.2 MG PO TABS: 1.0000 | ORAL_TABLET | Freq: Three times a day (TID) | ORAL | Status: DC | PRN

## 2012-12-17 NOTE — Progress Notes (Signed)
[name] West 03-Mar-1930 MRN [id number]  History of Present Illness:  This is an 79 year old white female with symptomatic diverticulosis and history of irritable bowel syndrome. Her last appointment was 4 years ago. She had a colonoscopy in February 2010 which showed an inflamed diverticulum. She was treated with Cipro and Flagyl. Prior colonoscopies were in 2000 and in 1990 showing moderate to severe left colon diverticulosis. She has not had any episodes recently. She is here to refill  Donnatal. She has mild constipation. She was hospitalized 2 months ago at [facility] with  chest pain. Cardiac causes were ruled out. She saw Dr. Chales Abrahams  from Gastroenterology,who suggested EGD  but her symptoms subsided and she declined further work up.. She denies heartburn, dysphagia or dyspepsia.   Past Medical History  Diagnosis Date  . Diverticulosis of colon (without mention of hemorrhage)   . Irritable bowel syndrome   . Cerebrovascular disease, unspecified   . Other and unspecified hyperlipidemia   . History of shingles   . Allergic rhinitis   . Hypertension   . Stroke     Hx of strokes  . Asthma   . Interstitial cystitis   . Stress disorder, acute    Past Surgical History  Procedure Laterality Date  . Appendectomy    . Tonsillectomy    . Dilation and curettage of uterus      reports that she has never smoked. She has never used smokeless tobacco. She reports that she drinks about 0.6 ounces of alcohol per week. She reports that she does not use illicit drugs. family history includes Cerebral aneurysm in her daughter; Heart disease in her father and sister. There is no history of Colon cancer. Allergies  Allergen Reactions  . Sulfonamide Derivatives Shortness Of Breath        Review of Systems: Stable weight. Denies rectal bleeding  The remainder of the 10 point ROS is negative except as outlined in H&P   Physical Exam: General appearance  Well developed, in no  distress. Eyes- non icteric. HEENT nontraumatic, normocephalic. Mouth no lesions, tongue papillated, no cheilosis. Neck supple without adenopathy, thyroid not enlarged, no carotid bruits, no JVD. Lungs Clear to auscultation bilaterally. Cor normal S1, normal S2, regular rhythm, no murmur,  quiet precordium. Abdomen: Soft, nontender, normoactive bowel sounds. No tenderness or mass. Rectal: Hard formed Hemoccult-negative stool. No fissure or hemorrhoids. Extremities no pedal edema. Skin no lesions. Neurological alert and oriented x 3. Psychological normal mood and affect.  Assessment and Plan:  Problem #33 79 year old white female with symptomatic diverticulosis, mild constipation and irritable bowel syndrome, currently under good control. We will refill her Donnatal, which she takes only on an as necessary basis. I advised her to increase the fiber in her diet and to take fiber supplements. She takes prunes.  Problem #2 Colorectal screening. There is no recall due to age.     12/17/2012 Lina Sar

## 2012-12-17 NOTE — Patient Instructions (Addendum)
We have sent the following medications to your pharmacy for you to pick up at your convenience: Donnatal  CC: Dr Lucianne Lei

## 2012-12-18 ENCOUNTER — Telehealth: Payer: Self-pay | Admitting: Internal Medicine

## 2012-12-18 NOTE — Telephone Encounter (Signed)
I have called and spoken to pharmacy tech and have given a verbal order for the donnatal since they did not receive the faxed rx.

## 2013-01-20 ENCOUNTER — Other Ambulatory Visit: Payer: Self-pay | Admitting: Internal Medicine

## 2013-06-16 ENCOUNTER — Other Ambulatory Visit: Payer: Self-pay | Admitting: Internal Medicine

## 2013-06-17 ENCOUNTER — Telehealth: Payer: Self-pay | Admitting: Internal Medicine

## 2013-06-17 NOTE — Telephone Encounter (Signed)
LMTCB-she needs to use allergy meds OTC and/or OTC allergy nasal spray. Pt will need to make and keep OV with CY as she has not been seen since 08-05-10; if she goes past 08-04-13 to see CY then she will be considered a new patient.

## 2013-06-18 NOTE — Telephone Encounter (Signed)
Spoke with pt and advised of Dr Roxy CedarYoung's recommendations to try OTC allergy meds or nasal spray.  Pt scheduled att for 07/18/13 at 3:30.

## 2013-06-24 ENCOUNTER — Ambulatory Visit (INDEPENDENT_AMBULATORY_CARE_PROVIDER_SITE_OTHER): Payer: Medicare Other | Admitting: Internal Medicine

## 2013-06-24 ENCOUNTER — Encounter: Payer: Self-pay | Admitting: Internal Medicine

## 2013-06-24 VITALS — BP 126/74 | HR 62 | Ht 62.5 in | Wt 111.6 lb

## 2013-06-24 DIAGNOSIS — J309 Allergic rhinitis, unspecified: Secondary | ICD-10-CM

## 2013-06-24 DIAGNOSIS — J302 Other seasonal allergic rhinitis: Secondary | ICD-10-CM

## 2013-06-24 DIAGNOSIS — J3089 Other allergic rhinitis: Principal | ICD-10-CM

## 2013-06-24 MED ORDER — AZELASTINE HCL 0.15 % NA SOLN: NASAL | Status: DC

## 2013-06-24 NOTE — Patient Instructions (Signed)
Script sent to refill azelastine nasal spray  Your primary doctor could refill this medicine in the future, so you don't need to come here unless you need our services.

## 2013-06-24 NOTE — Progress Notes (Signed)
Subjective:    Patient ID: [name] West, female    DOB: 01-01-1930, 80 y.o.   MRN: [id number]  HPI 08/05/10- 89 yoF followed for allergic rhinitis, complicated by hypertension and hx of CVA.here with daughter.  Last here- September 16, 2010.  Now says 3 months of increased watery eyes and nose. Refilled Nasonex but it hasn't helped. Scant yellow but no evident infection. Off and on for 2-3 days she has had chest pressure, especially yesterday. Denies pain, palpitation, nausea or sweats. Some smothered supine. Dry cough this week, maybe with some wheeze.  Denies choke or strangle with food or drink. Mouth feels dry despite rhinorhea.  Another daughter had died in [place]- subsequently BP was very high till treated at [facility]. At [facility] last week- claritin didn't seem to help watery eyes/ nose at all.   10/13/10- 80 yoF never smoker followed for allergic rhinitis, complicated by hypertension and hx of CVA C/O watery eyes- worst last week. Ongoing 2-3 weeks. Using phenylephrine otc nose drops since nasonex ran out. Lower right lid feels irritated. Throat pressure / choking sensation.when she lies down x past few months. Ok upright and swallowing, with no change in her voice.  Her PCP xray'ed her neck and told her 2 vertebrae were pressing down. Not coughing much unless she notes postnasal drip.  She had tried Careers adviser and asks about taking it more regularly.   06/24/13- 83 yoF never smoker followed for allergic rhinitis, complicated by hypertension and hx of CVA FOLLOWS FOR: last seen 10-13-10; has been doing well on nasal sprays-needed to follow up for refills. Husband here Chronic watery rhinorrhea. Astelin nasal spray helps. Occasional epistaxis.  ROS-see HPI Constitutional:   No-   weight loss, night sweats, fevers, chills, fatigue, lassitude. HEENT:   No-  headaches, difficulty swallowing, tooth/dental problems, sore throat,       No-  sneezing, itching, ear ache, nasal congestion,+ post nasal  drip,  CV:  No-   chest pain, orthopnea, PND, swelling in lower extremities, anasarca,                                  dizziness, palpitations Resp: No-   shortness of breath with exertion or at rest.              No-   productive cough,  No non-productive cough,  No- coughing up of blood.              No-   change in color of mucus.  No- wheezing.   Skin: No-   rash or lesions. GI:  No-   heartburn, indigestion, abdominal pain, nausea, vomiting, GU:  MS:  No-   joint pain or swelling.   Neuro-     nothing unusual Psych:  No- change in mood or affect. No depression or anxiety.  No memory loss.   Objective:  OBJ- Physical Exam General- Alert, Oriented, Affect-appropriate, Distress- none acute. Trim, alert Skin- rash-none, lesions- none, excoriation- none Lymphadenopathy- none Head- atraumatic            Eyes- Gross vision intact, PERRLA, conjunctivae and secretions clear            Ears- Hearing, canals-normal            Nose- Clear, no-Septal dev, mucus, polyps, erosion, perforation             Throat- Mallampati II , mucosa clear ,  drainage- none, tonsils- atrophic Neck- flexible , trachea midline, no stridor , thyroid nl, carotid no bruit Chest - symmetrical excursion , unlabored           Heart/CV- RRR , no murmur , no gallop  , no rub, nl s1 s2                           - JVD- none , edema- none, stasis changes- none, varices- none           Lung- clear to P&A, wheeze- none, cough- none , dullness-none, rub- none           Chest wall-  Abd- Br/ Gen/ Rectal- Not done, not indicated Extrem- cyanosis- none, clubbing, none, atrophy- none, strength- nl Neuro- grossly intact to observation  Assessment & Plan:

## 2013-07-18 ENCOUNTER — Ambulatory Visit: Payer: Medicare Other | Admitting: Internal Medicine

## 2013-07-19 ENCOUNTER — Encounter: Payer: Self-pay | Admitting: Internal Medicine

## 2013-07-19 NOTE — Assessment & Plan Note (Signed)
She reports Astelin nasal spray is sufficient and satisfactory. To keep things simple for her, I suggested that she just have her primary physician fill this prescription in the future. We will be happy to see her if she needs us.

## 2013-10-06 ENCOUNTER — Telehealth: Payer: Self-pay | Admitting: Internal Medicine

## 2013-10-06 NOTE — Telephone Encounter (Signed)
Patient has been seeing her PCP since May for diarrhea. She has had stool studies for c.diff that is negative. [place] [employer] and during that time she had dark stools that smell bad and are urgent. She has taken another stool study in at her PCP's office and should get the results on Thursday this week. Her PCP is recommending she see hr GI MD.Offered OV with extender this week but she wants to see Dr. Juanda ChanceBrodie. Scheduled patient on 10/17/13 at 2:30 PM with Dr. Juanda ChanceBrodie.

## 2013-10-09 ENCOUNTER — Ambulatory Visit: Payer: Medicare Other | Admitting: Nurse Practitioner

## 2013-10-14 ENCOUNTER — Encounter: Payer: Self-pay | Admitting: *Deleted

## 2013-10-16 ENCOUNTER — Encounter: Payer: Self-pay | Admitting: Internal Medicine

## 2013-10-17 ENCOUNTER — Encounter: Payer: Self-pay | Admitting: Internal Medicine

## 2013-10-17 ENCOUNTER — Ambulatory Visit (INDEPENDENT_AMBULATORY_CARE_PROVIDER_SITE_OTHER): Payer: Medicare Other | Admitting: Internal Medicine

## 2013-10-17 VITALS — BP 160/70 | HR 64 | Ht 61.75 in | Wt 111.2 lb

## 2013-10-17 DIAGNOSIS — R197 Diarrhea, unspecified: Secondary | ICD-10-CM

## 2013-10-17 DIAGNOSIS — K5289 Other specified noninfective gastroenteritis and colitis: Secondary | ICD-10-CM

## 2013-10-17 MED ORDER — METRONIDAZOLE 250 MG PO TABS: 250.0000 mg | ORAL_TABLET | Freq: Four times a day (QID) | ORAL | Status: DC

## 2013-10-17 MED ORDER — PB-HYOSCY-ATROPINE-SCOPOLAMINE 16.2 MG PO TABS: 1.0000 | ORAL_TABLET | Freq: Three times a day (TID) | ORAL | Status: DC

## 2013-10-17 NOTE — Patient Instructions (Signed)
We have sent the following medications to your pharmacy for you to pick up at your convenience: Flagyl 250 mg four times daily x 10 days Donnatal three times daily before meals  Please call our office in 1 week with an update on your condition. Ask to speak to [place] at [phone].  Please follow a low residue diet.  Low-Fiber Diet Fiber is found in fruits, vegetables, and whole grains. A low-fiber diet restricts fibrous foods that are not digested in the small intestine. A diet containing about 10-15 grams of fiber per day is considered low fiber. Low-fiber diets may be used to:  Promote healing and rest the bowel during intestinal flare-ups.  Prevent blockage of a partially obstructed or narrowed gastrointestinal tract.  Reduce fecal weight and volume.  Slow the movement of feces. You may be on a low-fiber diet as a transitional diet following surgery, after an injury (trauma), or because of a short (acute) or lifelong (chronic) illness. Your health care provider will determine the length of time you need to stay on this diet.  WHAT DO I NEED TO KNOW ABOUT A LOW-FIBER DIET? Always check the fiber content on the packaging's Nutrition Facts label, especially on foods from the grains list. Ask your dietitian if you have questions about specific foods that are related to your condition, especially if the food is not listed below. In general, a low-fiber food will have less than 2 g of fiber. WHAT FOODS CAN I EAT? Grains All breads and crackers made with white flour. Sweet rolls, doughnuts, waffles, pancakes, [place] toast, bagels. Pretzels, Melba toast, zwieback. Well-cooked cereals, such as cornmeal, farina, or cream cereals. Dry cereals that do not contain whole grains, fruit, or nuts, such as refined corn, wheat, rice, and oat cereals. Potatoes prepared any way without skins, plain pastas and noodles, refined white rice. Use white flour for baking and making sauces. Use allowed list of  grains for casseroles, dumplings, and puddings.  Vegetables Strained tomato and vegetable juices. Fresh lettuce, cucumber, spinach. Well-cooked (no skin or pulp) or canned vegetables, such as asparagus, bean sprouts, beets, carrots, green beans, mushrooms, potatoes, pumpkin, spinach, yellow squash, tomato sauce/puree, turnips, yams, and zucchini. Keep servings limited to  cup.  Fruits All fruit juices except prune juice. Cooked or canned fruits without skin and seeds, such as applesauce, apricots, cherries, fruit cocktail, grapefruit, grapes, mandarin oranges, melons, peaches, pears, pineapple, and plums. Fresh fruits without skin, such as apricots, avocados, bananas, melons, pineapple, nectarines, and peaches. Keep servings limited to  cup or 1 piece.  Meat and Other Protein Sources Ground or well-cooked tender beef, ham, veal, lamb, pork, or poultry. Eggs, plain cheese. Fish, oysters, shrimp, lobster, and other seafood. Liver, organ meats. Smooth nut butters. Dairy All milk products and alternative dairy substitutes, such as soy, rice, almond, and coconut, not containing added whole nuts, seeds, or added fruit. Beverages Decaf coffee, fruit, and vegetable juices or smoothies (small amounts, with no pulp or skins, and with fruits from allowed list), sports drinks, herbal tea. Condiments Ketchup, mustard, vinegar, cream sauce, cheese sauce, cocoa powder. Spices in moderation, such as allspice, basil, bay leaves, celery powder or leaves, cinnamon, cumin powder, curry powder, ginger, mace, marjoram, onion or garlic powder, oregano, paprika, parsley flakes, ground pepper, rosemary, sage, savory, tarragon, thyme, and turmeric. Sweets and Desserts Plain cakes and cookies, pie made with allowed fruit, pudding, custard, cream pie. Gelatin, fruit, ice, sherbet, frozen ice pops. Ice cream, ice milk without nuts. Plain hard  candy, honey, jelly, molasses, syrup, sugar, chocolate syrup, gumdrops, marshmallows.  Limit overall sugar intake.  Fats and Oil Margarine, butter, cream, mayonnaise, salad oils, plain salad dressings made from allowed foods. Choose healthy fats such as olive oil, canola oil, and omega-3 fatty acids (such as found in salmon or tuna) when possible.  Other Bouillon, broth, or cream soups made from allowed foods. Any strained soup. Casseroles or mixed dishes made with allowed foods. The items listed above may not be a complete list of recommended foods or beverages. Contact your dietitian for more options.  WHAT FOODS ARE NOT RECOMMENDED? Grains All whole wheat and whole grain breads and crackers. Multigrains, rye, bran seeds, nuts, or coconut. Cereals containing whole grains, multigrains, bran, coconut, nuts, raisins. Cooked or dry oatmeal, steel-cut oats. Coarse wheat cereals, granola. Cereals advertised as high fiber. Potato skins. Whole grain pasta, wild or brown rice. Popcorn. Coconut flour. Bran, buckwheat, corn bread, multigrains, rye, wheat germ.  Vegetables Fresh, cooked or canned vegetables, such as artichokes, asparagus, beet greens, broccoli, Brussels sprouts, cabbage, celery, cauliflower, corn, eggplant, kale, legumes or beans, okra, peas, and tomatoes. Avoid large servings of any vegetables, especially raw vegetables.  Fruits Fresh fruits, such as apples with or without skin, berries, cherries, figs, grapes, grapefruit, guavas, kiwis, mangoes, oranges, papayas, pears, persimmons, pineapple, and pomegranate. Prune juice and juices with pulp, stewed or dried prunes. Dried fruits, dates, raisins. Fruit seeds or skins. Avoid large servings of all fresh fruits. Meats and Other Protein Sources Tough, fibrous meats with gristle. Chunky nut butter. Cheese made with seeds, nuts, or other foods not recommended. Nuts, seeds, legumes (beans, including baked beans), dried peas, beans, lentils.  Dairy Yogurt or cheese that contains nuts, seeds, or added fruit.  Beverages Fruit juices  with high pulp, prune juice. Caffeinated coffee and teas.  Condiments Coconut, maple syrup, pickles, olives. Sweets and Desserts Desserts, cookies, or candies that contain nuts or coconut, chunky peanut butter, dried fruits. Jams, preserves with seeds, marmalade. Large amounts of sugar and sweets. Any other dessert made with fruits from the not recommended list.  Other Soups made from vegetables that are not recommended or that contain other foods not recommended.  The items listed above may not be a complete list of foods and beverages to avoid. Contact your dietitian for more information. Document Released: 09/09/2001 Document Revised: 03/25/2013 Document Reviewed: 02/10/2013 [facility] Patient Information 2015 [employer] [place]. This information is not intended to replace advice given to you by your health care provider. Make sure you discuss any questions you have with your health care provider.  CC:Dr Lucianne LeiNina Uppin

## 2013-10-17 NOTE — Progress Notes (Signed)
[name] West 1930/03/28 [id number]  Note: This dictation was prepared with Dragon digital system. Any transcriptional errors that result from this procedure are unintentional.   History of Present Illness:  This is an 80 year old white female with a subacute diarrheal illness [place] There was no nausea, vomiting or fever but her stools have been watery and foul-smelling for the past several months. She has lost 5 pounds. She has flatulence. We saw her in the past for symptomatic diverticulosis and irritable bowel syndrome. Her last colonoscopy in February 2010 showed an inflamed diverticulum but no evidence of colitis. An upper abdominal ultrasound in May 2014 showed a negative gallbladder. After the diarrhea started, she took Cipro 250 twice a day for 3 days and Flagyl 500 mg 4 times a day for 3 days. She did not even finish the prescription. Stool for pathogens was negative including lactoferrin, O&P and C. difficile. She reports having another set of stool for pathogens which again was negative. She denies rectal bleeding.Her husband had C.diff recently.    Past Medical History  Diagnosis Date  . Diverticulosis of colon (without mention of hemorrhage)   . Irritable bowel syndrome   . Cerebrovascular disease, unspecified   . Other and unspecified hyperlipidemia   . History of shingles   . Allergic rhinitis   . Hypertension   . Stroke     Hx of strokes  . Asthma   . Interstitial cystitis   . Stress disorder, acute   . Hyponatremia   . Osteoporosis   . PSVT (paroxysmal supraventricular tachycardia)   . Vitamin D deficiency   . Anxiety   . Postherpetic neuralgia   . Anemia   . DJD (degenerative joint disease)   . Chronic insomnia     Past Surgical History  Procedure Laterality Date  . Appendectomy    . Tonsillectomy    . Dilation and curettage of uterus      Allergies  Allergen Reactions  . Sulfonamide  Derivatives Shortness Of Breath    Family history and social history have been reviewed.  Review of Systems: Denies nausea vomiting heartburn  The remainder of the 10 point ROS is negative except as outlined in the H&P  Physical Exam: General Appearance Well developed, in no distress Eyes  Non icteric  HEENT  Non traumatic, normocephalic  Mouth No lesion, tongue papillated, no cheilosis Neck Supple without adenopathy, thyroid not enlarged, no carotid bruits, no JVD Lungs Clear to auscultation bilaterally COR Normal S1, normal S2, regular rhythm, no murmur, quiet precordium Abdomen mildly distended and tympanitic with hyperactive bowel sounds and diffuse tenderness more so in the left lower quadrant. There was no rebound. No ascites Rectal soft brown Hemoccult negative stool Extremities  No pedal edema Skin No lesions Neurological Alert and oriented x 3 Psychological Normal mood and affect  Assessment and Plan:   Problem #11 80 year old white female with a rather acute onset of diarrheal illness which is most likely infectious in origin. Giardia is a possibility, since it rarely shows in the stool, only 30% of the time. This also could be a bacterial infection. She was partially treated with Cipro and Flagyl but not long enough to make a difference. Her husband had C. difficile colitis in the past. There is a possibility that she has C. difficile colitis. We will start her on metronidazole 250 mg 4 times a day for 10 days. She is to call us in one week with  an update. If she continues to have diarrhea, we will proceed with a flexible sigmoidoscopy and random biopsies to rule out microscopic colitis. We have instructed her on a low-residue diet and we will refill her Donnatal 1 tablet 3 times a day before meals.    Lina Sar 10/17/2013

## 2013-10-24 ENCOUNTER — Telehealth: Payer: Self-pay | Admitting: Internal Medicine

## 2013-10-24 NOTE — Telephone Encounter (Signed)
Left a message for patient to call back. 

## 2013-10-24 NOTE — Telephone Encounter (Signed)
Spoke with patient's husband and he reports that patient is better this week since taking Flagyl and Donnatal. She will complete the Flagyl on 10/27/13 and is asking if she should continue it longer and how long should she continue the Donnatal. Please, advise.

## 2013-10-25 NOTE — Telephone Encounter (Signed)
Please refill Flagyl, reduce the dose to 250 mg po bid x 10 days, #20, and reduce Donnatal to 1 po  q am. X 4 weeks., may need to refill.

## 2013-10-27 ENCOUNTER — Other Ambulatory Visit: Payer: Self-pay | Admitting: *Deleted

## 2013-10-27 MED ORDER — PB-HYOSCY-ATROPINE-SCOPOLAMINE 16.2 MG PO TABS: ORAL_TABLET | ORAL | Status: DC

## 2013-10-27 MED ORDER — METRONIDAZOLE 250 MG PO TABS: ORAL_TABLET | ORAL | Status: DC

## 2013-10-27 NOTE — Telephone Encounter (Signed)
Rx's sent and patient notified of recommendations.

## 2013-11-17 ENCOUNTER — Telehealth: Payer: Self-pay | Admitting: Internal Medicine

## 2013-11-17 NOTE — Telephone Encounter (Signed)
Patient calling to report her diarrhea is much better. She has only had to take anti diarrhea medications twice. She continues to have the horrible gas. She cannot control it and does not go out because of this. She is asking if Dr. Juanda ChanceBrodie wants to do the test on her large colon. Please, advise.

## 2013-11-18 MED ORDER — METRONIDAZOLE 250 MG PO TABS: ORAL_TABLET | ORAL | Status: DC

## 2013-11-18 NOTE — Telephone Encounter (Signed)
Patient given recommendations. Rx for Flagyl sent.

## 2013-11-18 NOTE — Telephone Encounter (Signed)
Please start Probiotic 1 po qd, no need for colonoscopy . Refill Flagyl 250 mg, #20 1 po bid.

## 2013-11-20 ENCOUNTER — Encounter: Payer: Self-pay | Admitting: Internal Medicine

## 2014-01-22 IMAGING — US US RADIOLOGIST EVAL AND MGMT
1 series · 14 of 16 positions shown · non-contrast
Comparison: none

[Series 1: us radiologist eval and mgmt · 14 of 31 slices shown]
[im 1/31]
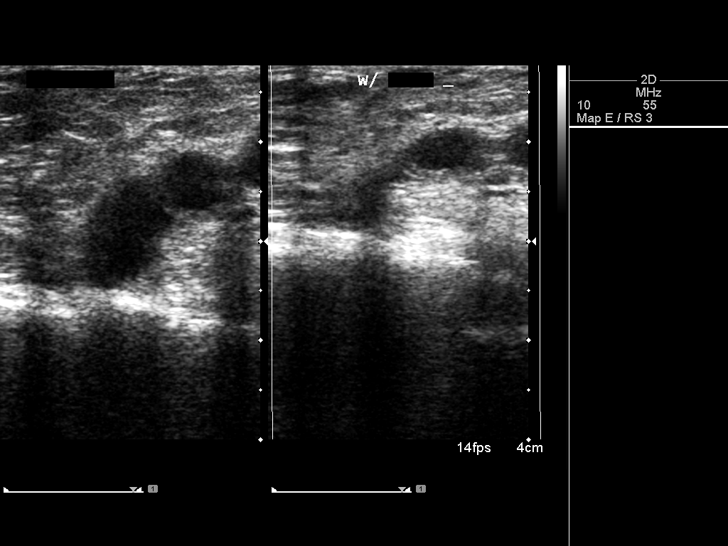
[im 3/31]
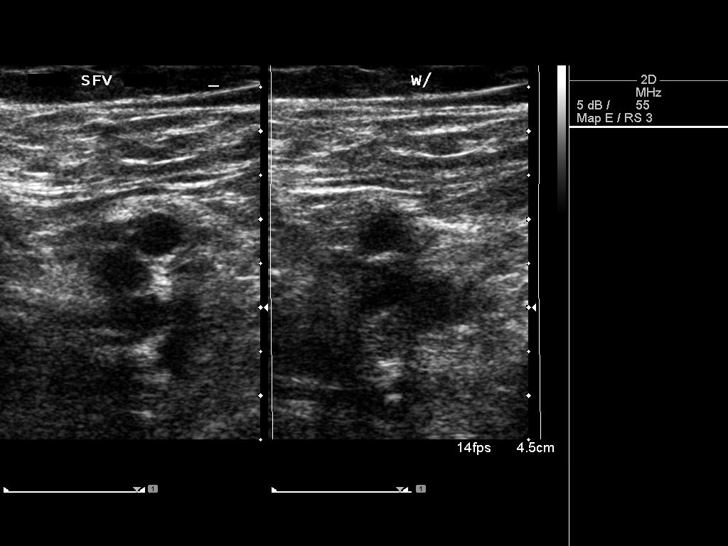
[im 5/31]
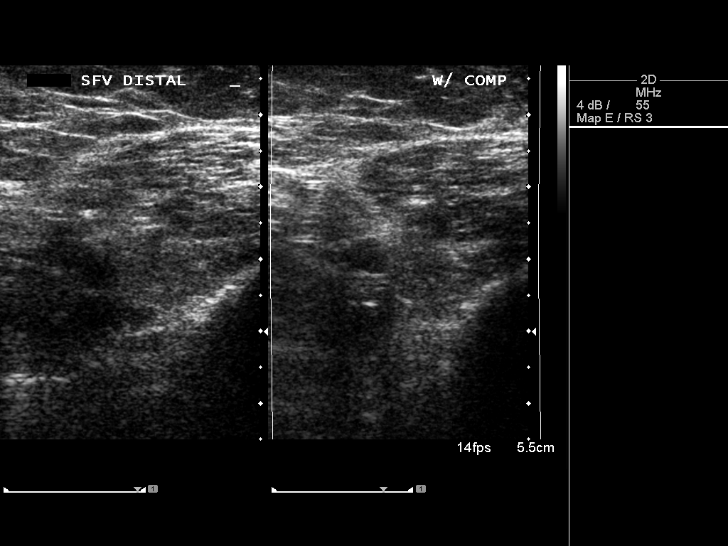
[im 9/31]
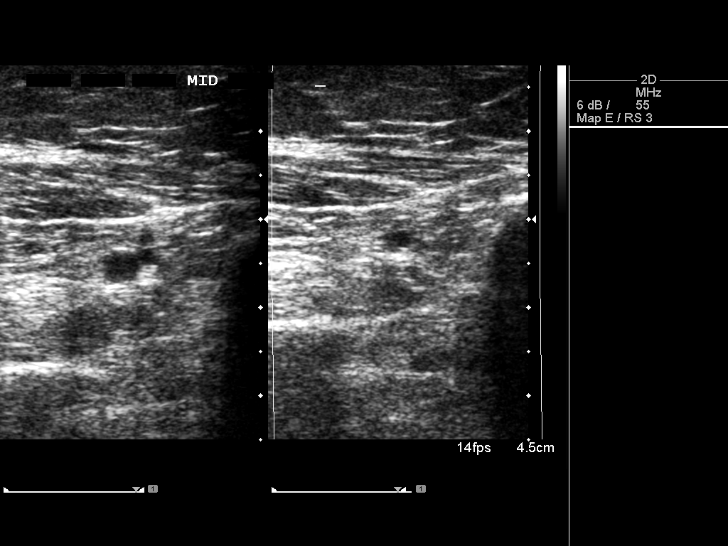
[im 11/31]
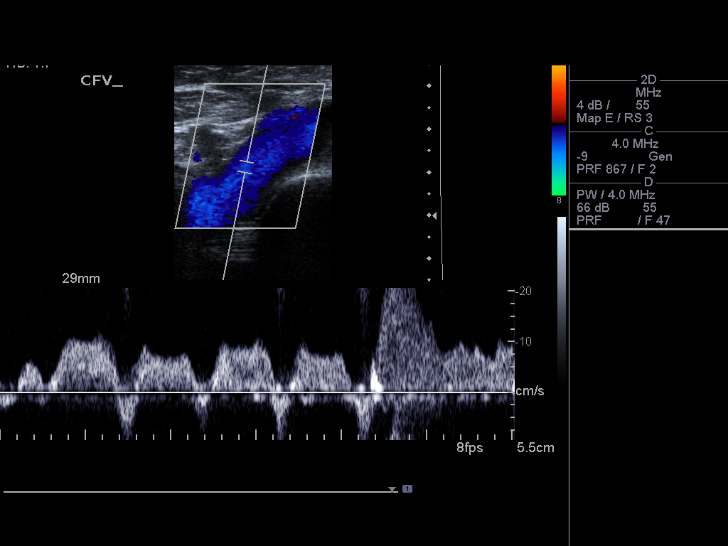
[im 13/31]
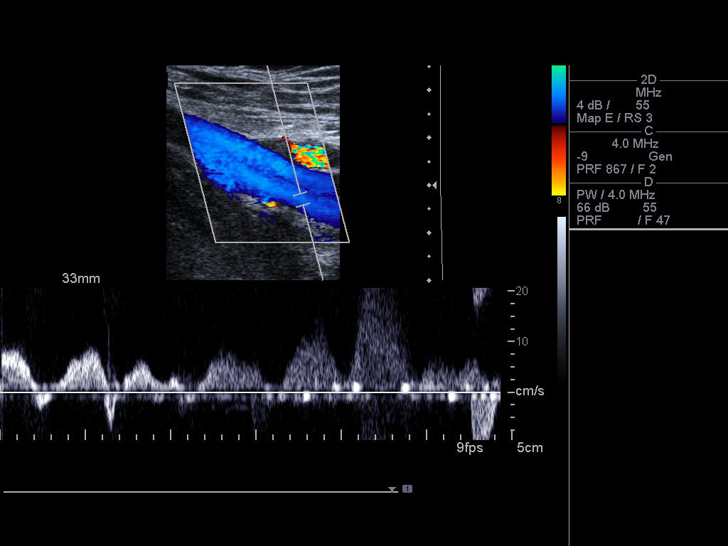
[im 15/31]
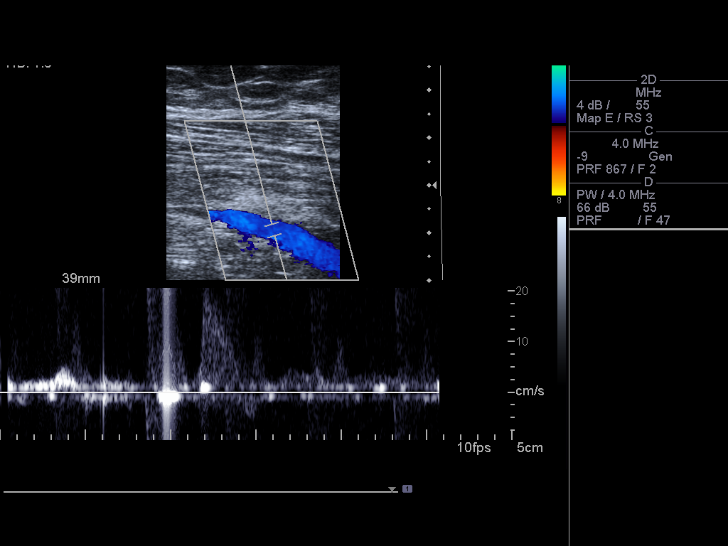
[im 17/31]
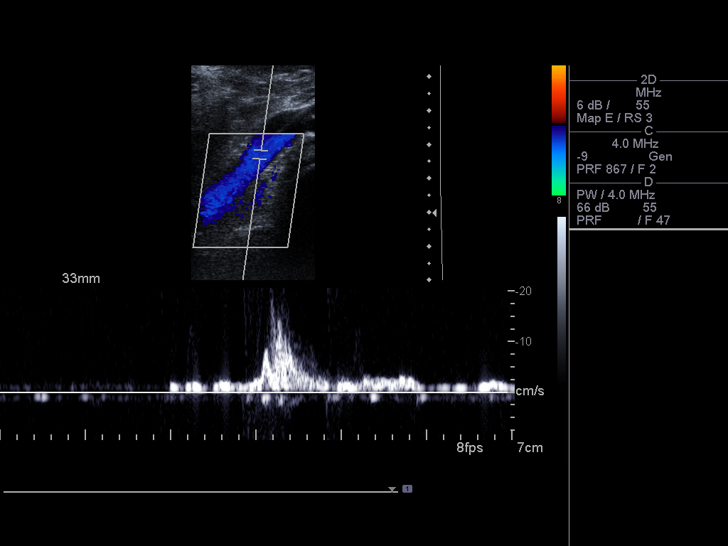
[im 19/31]
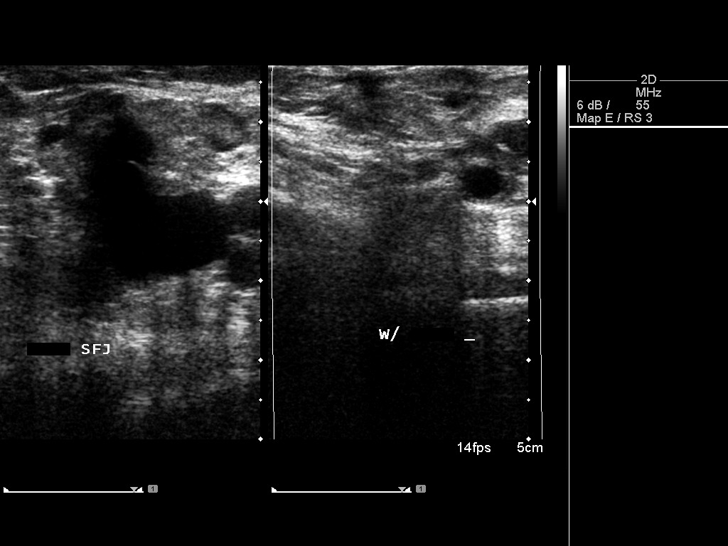
[im 21/31]
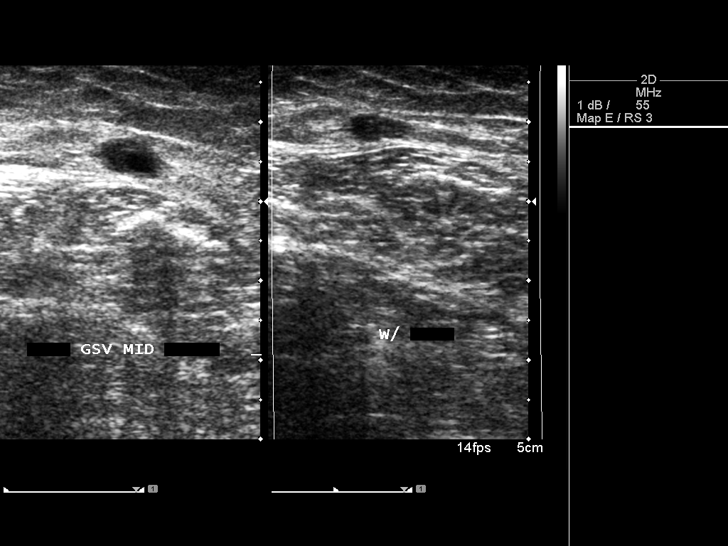
[im 25/31]
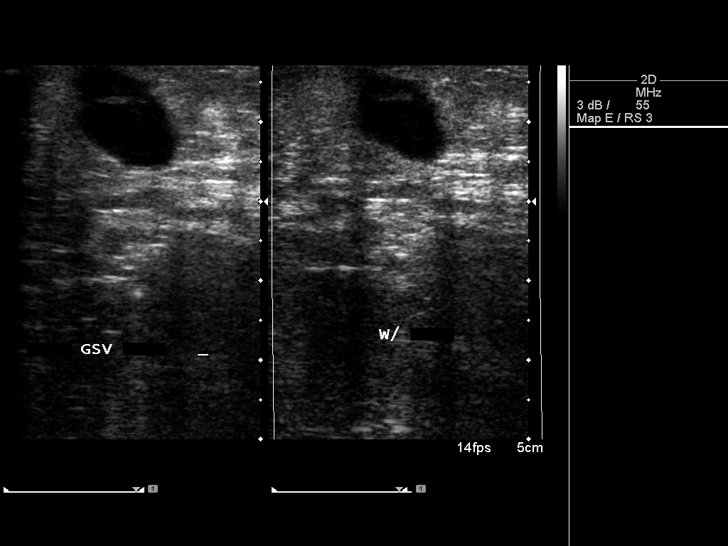
[im 27/31]
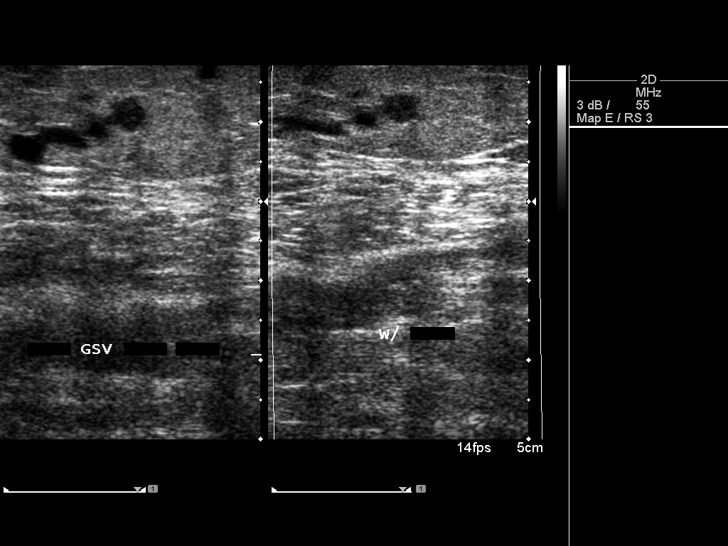
[im 29/31]
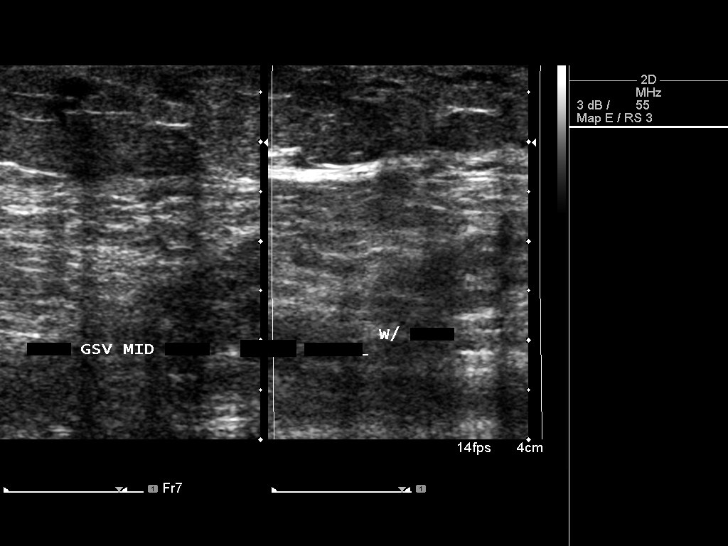
[im 31/31]
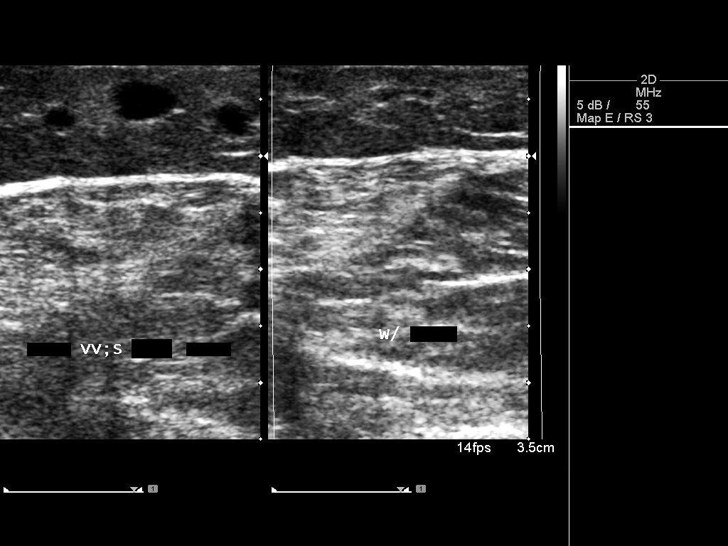

[14 of 16 positions shown; findings below may reference images not displayed]

ESTABLISHED PATIENT PATIENT VISIT LEVEL II

The the patient returns 1 week after laser occlusion of the greater
saphenous vein and sclerotherapy of varicosities.  She denies any
symptoms were pain.  She has been performing moderate activity
characterized by walking 20-30 minutes per day.  She denies any
fevers or chills or skin breakdown.

Upon physical examination the left lower extremity is warm and
pink.  There is bruising along the course of the greater saphenous
vein but no skin breakdown.  Dorsalis pedis and posterior tibial
pulses are 2+.  No edema.

Ultrasound demonstrates occlusion of most of the varicosities in
the calf and occlusion of the greater saphenous vein in the distal
thigh and calf.  There is partial thrombosis of the greater
saphenous vein in the mid and upper thigh.

There has been successful treatment of the varicosities in the
greater saphenous vein, without complication.  The upper portion of
the greater saphenous vein remains partially patent and hopefully
this will completely occluded by the 4-week interval.

## 2014-02-02 ENCOUNTER — Encounter: Payer: Self-pay | Admitting: Internal Medicine

## 2014-03-18 ENCOUNTER — Telehealth: Payer: Self-pay | Admitting: Internal Medicine

## 2014-03-19 NOTE — Telephone Encounter (Signed)
As per my last note 10/2013, if diarrhea continues, she will need flex sigmoid to r/o microscopic colitis.  I will see her on in the office.Try Xifaxin 550 mg, 1 po bid x 10 days, #20

## 2014-03-19 NOTE — Telephone Encounter (Signed)
Spoke with patient and she is calling because she is still having problems with gas and diarrhea. The diarrhea if off and on. Her husband has it also. She takes anti-diarrheal medications and it helps but it keeps coming back. She states she had stool studies x 2 at her PCP, Dr Darien RamusYoupin. She is worried she might have Giardia(her husband's cardiologist mention this) She scheduled an OV on 03/30/14 at 10:00 AM. Does she need to do any stool studies here?

## 2014-03-20 MED ORDER — RIFAXIMIN 550 MG PO TABS: 550.0000 mg | ORAL_TABLET | Freq: Two times a day (BID) | ORAL | Status: DC

## 2014-03-20 NOTE — Telephone Encounter (Signed)
Patient notified of recommendations. Rx sent to pharmacy. 

## 2014-03-23 ENCOUNTER — Encounter: Payer: Self-pay | Admitting: *Deleted

## 2014-03-23 ENCOUNTER — Telehealth: Payer: Self-pay | Admitting: *Deleted

## 2014-03-23 NOTE — Telephone Encounter (Signed)
Try Colestid 1gm, #60 2 po qd.

## 2014-03-23 NOTE — Telephone Encounter (Signed)
Dr Juanda ChanceBrodie, Xifaxan BID x 10 days has been denied by [employer] insurance company (reference # [id number]). Patient has appointment on 03/30/14. Do you want me to try her on something else (looks like she has already tried Flagyl).

## 2014-03-24 MED ORDER — COLESTIPOL HCL 1 G PO TABS: 1.0000 g | ORAL_TABLET | Freq: Two times a day (BID) | ORAL | Status: DC

## 2014-03-24 NOTE — Telephone Encounter (Signed)
Rx for colestid sent to pharmacy and xifaxan d/c'ed. Patient advised of new plan.

## 2014-03-25 ENCOUNTER — Telehealth: Payer: Self-pay | Admitting: Internal Medicine

## 2014-03-25 NOTE — Telephone Encounter (Signed)
Pharmacy told pt rx for colestid is used for cholesterol. I explained that we use it to help with diarrhea (there are multiple uses for this medication). She verbalizes understanding.

## 2014-03-30 ENCOUNTER — Encounter: Payer: Self-pay | Admitting: Internal Medicine

## 2014-03-30 ENCOUNTER — Ambulatory Visit (INDEPENDENT_AMBULATORY_CARE_PROVIDER_SITE_OTHER): Payer: Medicare Other | Admitting: Internal Medicine

## 2014-03-30 VITALS — BP 160/76 | HR 80 | Ht 61.75 in | Wt 112.1 lb

## 2014-03-30 DIAGNOSIS — R197 Diarrhea, unspecified: Secondary | ICD-10-CM

## 2014-03-30 MED ORDER — RIFAXIMIN 550 MG PO TABS: 550.0000 mg | ORAL_TABLET | Freq: Two times a day (BID) | ORAL | Status: DC

## 2014-03-30 NOTE — Progress Notes (Signed)
[name] West 1929/11/18 [id number]  Note: This dictation was prepared with Dragon digital system. Any transcriptional errors that result from this procedure are unintentional.   History of Present Illness:  This is an 80 year old white female with a diarrheal illness which [place] Stool cultures for pathogens were negative on 2 separate occasions. She was treated with Flagyl and Cipro without much improvement. We have put her on antispasmodics as well as colestipol 1 g twice a day. She is improved but not well. She has occasional accidents and large amount of flatus. She denies abdominal pain. Her weight has been low at 112 pounds. Her last colonoscopy in February 2010 showed diverticulosis. Her husband has recently developed diarrhea as well and his stool cultures have also been negative for pathogens.    Past Medical History  Diagnosis Date  . Diverticulosis of colon (without mention of hemorrhage)   . Irritable bowel syndrome   . Cerebrovascular disease, unspecified   . Other and unspecified hyperlipidemia   . History of shingles   . Allergic rhinitis   . Hypertension   . Stroke     Hx of strokes  . Asthma   . Interstitial cystitis   . Stress disorder, acute   . Hyponatremia   . Osteoporosis   . PSVT (paroxysmal supraventricular tachycardia)   . Vitamin D deficiency   . Anxiety   . Postherpetic neuralgia   . Anemia   . DJD (degenerative joint disease)   . Chronic insomnia     Past Surgical History  Procedure Laterality Date  . Appendectomy    . Tonsillectomy    . Dilation and curettage of uterus      Allergies  Allergen Reactions  . Sulfonamide Derivatives Shortness Of Breath    Family history and social history have been reviewed.  Review of Systems: Negative for rectal bleeding or weight loss. Negative for abdominal pain  The remainder of the 10 point ROS is negative except as outlined in the  H&P  Physical Exam: General Appearance thin, in no distress Eyes  Non icteric  HEENT  Non traumatic, normocephalic  Mouth No lesion, tongue papillated, no cheilosis Neck Supple without adenopathy, thyroid not enlarged, no carotid bruits, no JVD Lungs Clear to auscultation bilaterally COR Normal S1, normal S2, regular rhythm, no murmur, quiet precordium Abdomen scaphoid, soft. Nontender. Hyperactive bowel sounds. Increased tympany Rectal not done Extremities  No pedal edema Skin No lesions Neurological Alert and oriented x 3 Psychological Normal mood and affect  Assessment and Plan:   Problem #661 80 year old white female with a rather acute onset of diarrhea 6 months ago  and history of irritable bowel syndrome. She has failed antibiotic therapy treatment. Directed toward infectious colitis. Her stool for pathogens have been negative on 2 occasions. We will proceed with a flexible sigmoidoscopy and biopsies to rule out microscopic colitis. She will restart probiotics. We will appeal the denial of Xifaxin 550 mg twice a day for 14 days by her [employer].. She is to continue colestipol. She has been using Donatal but feels that it is not as effective as the old Donatal.    [name] West 03/30/2014

## 2014-03-30 NOTE — Patient Instructions (Signed)
You have been scheduled for a flexible sigmoidoscopy. Please follow the written instructions given to you at your visit today. If you use inhalers (even only as needed), please bring them with you on the day of your procedure.  Please purchase the following medications over the counter and take as directed: Align-1 capsule once daily  We will send Xifaxan to the pharmacy.  CC:Dr Lucianne LeiNina Uppin

## 2014-03-31 ENCOUNTER — Telehealth: Payer: Self-pay | Admitting: *Deleted

## 2014-03-31 NOTE — Telephone Encounter (Signed)
I have spoken to patient to advise that per Dr Mathis BudUppin, she may hold her Plavix 5 days prior to her scheduled flexible sigmoidoscopy on 04/22/14. She verbalizes understanding.

## 2014-04-07 ENCOUNTER — Telehealth: Payer: Self-pay | Admitting: *Deleted

## 2014-04-07 NOTE — Telephone Encounter (Signed)
[employer] has approved patient's Xifaxan twice daily from 04/01/14-04/16/14. Case # is [id number] [place]. Patient states that she was able to get prescription.

## 2014-04-14 ENCOUNTER — Other Ambulatory Visit: Payer: Self-pay | Admitting: Internal Medicine

## 2014-04-21 ENCOUNTER — Other Ambulatory Visit: Payer: Self-pay | Admitting: Internal Medicine

## 2014-04-22 ENCOUNTER — Ambulatory Visit (AMBULATORY_SURGERY_CENTER): Payer: 59 | Admitting: Internal Medicine

## 2014-04-22 ENCOUNTER — Encounter: Payer: Self-pay | Admitting: Internal Medicine

## 2014-04-22 VITALS — BP 151/75 | HR 74 | Temp 97.5°F | Resp 14 | Ht 61.0 in | Wt 112.0 lb

## 2014-04-22 DIAGNOSIS — R197 Diarrhea, unspecified: Secondary | ICD-10-CM

## 2014-04-22 DIAGNOSIS — K5730 Diverticulosis of large intestine without perforation or abscess without bleeding: Secondary | ICD-10-CM

## 2014-04-22 MED ORDER — SODIUM CHLORIDE 0.9 % IV SOLN: 500.0000 mL | INTRAVENOUS | Status: DC

## 2014-04-22 NOTE — Progress Notes (Signed)
A/ox3, pleased with MAC, report to RN 

## 2014-04-22 NOTE — Op Note (Signed)
[facility] [address] [place], [id number]   FLEXIBLE SIGMOIDOSCOPY PROCEDURE REPORT  PATIENT: [name] West, [name] R  MR#: [id number] BIRTHDATE: 1929/10/11 , 84  yrs. old GENDER: female ENDOSCOPIST: Hart Carwinora M Brodie, MD REFERRED BY: Dr Carlyle LipaNina Upin PROCEDURE DATE:  04/22/2014 PROCEDURE:   Sigmoidoscopy with biopsy ASA CLASS:   Class II INDICATIONS:diarrhea since summer 2015.  Stool cultures negative. No response to Cipro and Flagyl.  Rule out microscopic colitis. Last full colonoscopy February 2010. MEDICATIONS: Monitored anesthesia care and Propofol 120 mg IV  DESCRIPTION OF PROCEDURE:   After the risks benefits and alternatives of the procedure were thoroughly explained, informed consent was obtained.  Digital exam revealed no abnormalities of the rectum. The LB PFC-H190 [id number]  endoscope was introduced through the anus  and advanced to the sigmoid colon , The exam was Without limitations.    The quality of the prep was The overall prep quality was adequate. .  The instrument was then slowly withdrawn as the mucosa was fully examined.         COLON FINDINGS: There was moderate diverticulosis noted in the distal sigmoid colon with associated muscular hypertrophy and angulation. random biopsies of the left colon were obtained to rule out microscopic colitis   Retroflexed views revealed no abnormalities.    The scope was then withdrawn from the patient and the procedure terminated.  COMPLICATIONS: There were no immediate complications.  ENDOSCOPIC IMPRESSION: There was moderate diverticulosis noted in the distal sigmoid colon random biopsies of the left colon to rule out microscopic colitis  RECOMMENDATIONS:  Await biopsy results continue colestipol 2 g daily Continue Donnatal  one by mouth 3 times a day when necessary  REPEAT EXAM: for recall: no repeat exam necessary.  eSigned:  Hart Carwinora M Brodie, MD 04/22/2014 3:29 PM   CC:

## 2014-04-22 NOTE — Progress Notes (Signed)
YOU HAD AN ENDOSCOPIC PROCEDURE TODAY AT THE [facility]: Refer to the procedure report that was given to you for any specific questions about what was found during the examination.  If the procedure report does not answer your questions, please call your gastroenterologist to clarify.  If you requested that your care partner not be given the details of your procedure findings, then the procedure report has been included in a sealed envelope for you to review at your convenience later.  YOU SHOULD EXPECT: Some feelings of bloating in the abdomen. Passage of more gas than usual.  Walking can help get rid of the air that was put into your GI tract during the procedure and reduce the bloating. If you had a lower endoscopy (such as a colonoscopy or flexible sigmoidoscopy) you may notice spotting of blood in your stool or on the toilet paper. If you underwent a bowel prep for your procedure, then you may not have a normal bowel movement for a few days.  DIET: Your first meal following the procedure should be a light meal and then it is ok to progress to your normal diet.  A half-sandwich or bowl of soup is an example of a good first meal.  Heavy or fried foods are harder to digest and may make you feel nauseous or bloated.  Likewise meals heavy in dairy and vegetables can cause extra gas to form and this can also increase the bloating.  Drink plenty of fluids but you should avoid alcoholic beverages for 24 hours.  ACTIVITY: Your care partner should take you home directly after the procedure.  You should plan to take it easy, moving slowly for the rest of the day.  You can resume normal activity the day after the procedure however you should NOT DRIVE or use heavy machinery for 24 hours (because of the sedation medicines used during the test).    SYMPTOMS TO REPORT IMMEDIATELY: A gastroenterologist can be reached at any hour.  During normal business hours, 8:30 AM to 5:00 PM Monday through Friday,  call [phone].  After hours and on weekends, please call the GI answering service at [phone] who will take a message and have the physician on call contact you.   Following lower endoscopy (colonoscopy or flexible sigmoidoscopy):  Excessive amounts of blood in the stool  Significant tenderness or worsening of abdominal pains  Swelling of the abdomen that is new, acute  Fever of 100F or higher  Following upper endoscopy (EGD)  Vomiting of blood or coffee ground material  New chest pain or pain under the shoulder blades  Painful or persistently difficult swallowing  New shortness of breath  Fever of 100F or higher  Black, tarry-looking stools  FOLLOW UP: If any biopsies were taken you will be contacted by phone or by letter within the next 1-3 weeks.  Call your gastroenterologist if you have not heard about the biopsies in 3 weeks.  Our staff will call the home number listed on your records the next business day following your procedure to check on you and address any questions or concerns that you may have at that time regarding the information given to you following your procedure. This is a courtesy call and so if there is no answer at the home number and we have not heard from you through the emergency physician on call, we will assume that you have returned to your regular daily activities without incident.  SIGNATURES/CONFIDENTIALITY: You and/or your care   partner have signed paperwork which will be entered into your electronic medical record.  These signatures attest to the fact that that the information above on your After Visit Summary has been reviewed and is understood.  Full responsibility of the confidentiality of this discharge information lies with you and/or your care-partner.  

## 2014-04-22 NOTE — Patient Instructions (Signed)
YOU HAD AN ENDOSCOPIC PROCEDURE TODAY AT THE [facility]: Refer to the procedure report that was given to you for any specific questions about what was found during the examination.  If the procedure report does not answer your questions, please call your gastroenterologist to clarify.  If you requested that your care partner not be given the details of your procedure findings, then the procedure report has been included in a sealed envelope for you to review at your convenience later.  YOU SHOULD EXPECT: Some feelings of bloating in the abdomen. Passage of more gas than usual.  Walking can help get rid of the air that was put into your GI tract during the procedure and reduce the bloating. If you had a lower endoscopy (such as a colonoscopy or flexible sigmoidoscopy) you may notice spotting of blood in your stool or on the toilet paper. If you underwent a bowel prep for your procedure, then you may not have a normal bowel movement for a few days.  DIET: Your first meal following the procedure should be a light meal and then it is ok to progress to your normal diet.  A half-sandwich or bowl of soup is an example of a good first meal.  Heavy or fried foods are harder to digest and may make you feel nauseous or bloated.  Likewise meals heavy in dairy and vegetables can cause extra gas to form and this can also increase the bloating.  Drink plenty of fluids but you should avoid alcoholic beverages for 24 hours.  ACTIVITY: Your care partner should take you home directly after the procedure.  You should plan to take it easy, moving slowly for the rest of the day.  You can resume normal activity the day after the procedure however you should NOT DRIVE or use heavy machinery for 24 hours (because of the sedation medicines used during the test).    SYMPTOMS TO REPORT IMMEDIATELY: A gastroenterologist can be reached at any hour.  During normal business hours, 8:30 AM to 5:00 PM Monday through Friday,  call [phone].  After hours and on weekends, please call the GI answering service at [phone] who will take a message and have the physician on call contact you.   Following lower endoscopy (colonoscopy or flexible sigmoidoscopy):  Excessive amounts of blood in the stool  Significant tenderness or worsening of abdominal pains  Swelling of the abdomen that is new, acute  Fever of 100F or higher      FOLLOW UP: If any biopsies were taken you will be contacted by phone or by letter within the next 1-3 weeks.  Call your gastroenterologist if you have not heard about the biopsies in 3 weeks.  Our staff will call the home number listed on your records the next business day following your procedure to check on you and address any questions or concerns that you may have at that time regarding the information given to you following your procedure. This is a courtesy call and so if there is no answer at the home number and we have not heard from you through the emergency physician on call, we will assume that you have returned to your regular daily activities without incident.  SIGNATURES/CONFIDENTIALITY: You and/or your care partner have signed paperwork which will be entered into your electronic medical record.  These signatures attest to the fact that that the information above on your After Visit Summary has been reviewed and is understood.  Full responsibility of   the confidentiality of this discharge information lies with you and/or your care-partner.   Resume medications. Information given on diverticulosis with discharge instructions.    

## 2014-04-23 ENCOUNTER — Telehealth: Payer: Self-pay | Admitting: *Deleted

## 2014-04-23 NOTE — Telephone Encounter (Signed)
  Follow up Call-  Call back number 04/22/2014  Post procedure Call Back phone  # [phone]  Permission to leave phone message Yes     Patient questions:  Do you have a fever, pain , or abdominal swelling? No. Pain Score  0 *  Have you tolerated food without any problems? Yes.    Have you been able to return to your normal activities? Yes.    Do you have any questions about your discharge instructions: Diet   No. Medications  No. Follow up visit  No.  Do you have questions or concerns about your Care? No.  Actions: * If pain score is 4 or above: No action needed, pain <4.

## 2014-04-29 ENCOUNTER — Encounter: Payer: Self-pay | Admitting: Internal Medicine

## 2014-05-04 ENCOUNTER — Telehealth: Payer: Self-pay | Admitting: Internal Medicine

## 2014-05-04 ENCOUNTER — Ambulatory Visit (INDEPENDENT_AMBULATORY_CARE_PROVIDER_SITE_OTHER): Payer: 59 | Admitting: Physician Assistant

## 2014-05-04 ENCOUNTER — Encounter: Payer: Self-pay | Admitting: Physician Assistant

## 2014-05-04 ENCOUNTER — Other Ambulatory Visit (INDEPENDENT_AMBULATORY_CARE_PROVIDER_SITE_OTHER): Payer: 59

## 2014-05-04 VITALS — BP 150/70 | HR 68 | Ht 61.75 in | Wt 109.2 lb

## 2014-05-04 DIAGNOSIS — R109 Unspecified abdominal pain: Secondary | ICD-10-CM

## 2014-05-04 DIAGNOSIS — K625 Hemorrhage of anus and rectum: Secondary | ICD-10-CM

## 2014-05-04 LAB — BASIC METABOLIC PANEL
BUN: 14 mg/dL (ref 6–23)
CO2: 30 meq/L (ref 19–32)
Calcium: 8.8 mg/dL (ref 8.4–10.5)
Chloride: 99 mEq/L (ref 96–112)
Creatinine, Ser: 0.62 mg/dL (ref 0.40–1.20)
GFR: 97.87 mL/min (ref >60.00)
Glucose, Bld: 94 mg/dL (ref 70–99)
Potassium: 4.3 mEq/L (ref 3.5–5.1)
Sodium: 133 mEq/L — ABNORMAL LOW (ref 135–145)

## 2014-05-04 LAB — CBC WITH DIFFERENTIAL/PLATELET
BASOS PCT: 0.7 % (ref 0.0–3.0)
Basophils Absolute: 0 10*3/uL (ref 0.0–0.1)
EOS ABS: 0.1 10*3/uL (ref 0.0–0.7)
Eosinophils Relative: 0.8 % (ref 0.0–5.0)
HEMATOCRIT: 33.2 % — AB (ref 36.0–46.0)
Hemoglobin: 11.2 g/dL — ABNORMAL LOW (ref 12.0–15.0)
LYMPHS PCT: 30.7 % (ref 12.0–46.0)
Lymphs Abs: 2.1 10*3/uL (ref 0.7–4.0)
MCHC: 33.7 g/dL (ref 30.0–36.0)
MCV: 89.2 fl (ref 78.0–100.0)
MONO ABS: 0.6 10*3/uL (ref 0.1–1.0)
Monocytes Relative: 9.1 % (ref 3.0–12.0)
Neutro Abs: 4 10*3/uL (ref 1.4–7.7)
Neutrophils Relative %: 58.7 % (ref 43.0–77.0)
PLATELETS: 283 10*3/uL (ref 150.0–400.0)
RBC: 3.72 Mil/uL — AB (ref 3.87–5.11)
RDW: 12.8 % (ref 11.5–15.5)
WBC: 6.9 10*3/uL (ref 4.0–10.5)

## 2014-05-04 MED ORDER — DESOXIMETASONE 0.25 % EX CREA: 1.0000 "application " | TOPICAL_CREAM | Freq: Two times a day (BID) | CUTANEOUS | Status: DC

## 2014-05-04 NOTE — Patient Instructions (Addendum)
Please go to the basement level to have your labs drawn today,  Do not take the Plavix for now.  We sent a prescription to [facility].  1. Desoximetasone cream. Use rectally for irritation.  Come to our lab tomorrow Tuesday 2-2 after the CT scan.   Then go home.    You have been scheduled for a CT scan of the abdomen and pelvis at [facility] ([address]---this is in the same building as Press photographer).   You are scheduled on Tuesday 05-05-2014 at 10:15 AM . You should arrive at 10:00 AM  prior to your appointment time for registration. Please follow the written instructions below on the day of your exam:  WARNING: IF YOU ARE ALLERGIC TO IODINE/X-RAY DYE, PLEASE NOTIFY RADIOLOGY IMMEDIATELY AT [phone]! YOU WILL BE GIVEN A 13 HOUR PREMEDICATION PREP.  1) Do not eat or drink anything after 6:15 am  (4 hours prior to your test) 2) You have been given 2 bottles of oral contrast to drink. The solution may taste better if refrigerated, but do NOT add ice or any other liquid to this solution. Shake well before drinking.    Drink 1 bottle of contrast @ 8:15 am  (2 hours prior to your exam)  Drink 1 bottle of contrast @ 9:15 am  (1 hour prior to your exam)  You may take any medications as prescribed with a small amount of water except for the following: Metformin, Glucophage, Glucovance, Avandamet, Riomet, Fortamet, Actoplus Met, Janumet, Glumetza or Metaglip. The above medications must be held the day of the exam AND 48 hours after the exam.  The purpose of you drinking the oral contrast is to aid in the visualization of your intestinal tract. The contrast solution may cause some diarrhea. Before your exam is started, you will be given a small amount of fluid to drink. Depending on your individual set of symptoms, you may also receive an intravenous injection of x-ray contrast/dye. Plan on being at [facility] for 30 minutes or long, depending on the type  of exam you are having performed.  If you have any questions regarding your exam or if you need to reschedule, you may call the CT department at [phone] between the hours of 8:00 am and 5:00 pm, Monday-Friday.  ________________________________________________________________________

## 2014-05-04 NOTE — Progress Notes (Signed)
Reviewed. It is really puzzling. I agree with CT scan of the abdomen and monitoring her CBC. I will be happy to see her this week if necessary.

## 2014-05-04 NOTE — Telephone Encounter (Signed)
Patient calling to report that on Saturday night, she felt like she needed to pass gas and went to the bathroom. She passed bright red blood and a clot with small amount of stool. She reports she continues to have bright, red blood from rectum when she passes gas or has a stool. She has never had this before. No pain. Patient had a flex sig with biopsy 2 weeks ago. Scheduled with Mike GipAmy Esterwood, PA today at 2:30 PM.

## 2014-05-04 NOTE — Progress Notes (Signed)
Patient ID: JONITA HIROTA, female   DOB: May 03, 1929, 82 y.o.   MRN: [id number]   Subjective:    Patient ID: NAYOMI TABRON, female    DOB: 12-Jun-1929, 81 y.o.   MRN: [id number]  HPI Fallynn is a very pleasant 81 year old white female known to Dr. Lina Sar. She has history of IBS, cerebral vascular disease with a TIA in 2010-for which she is now on Plavix and aspirin. Also with history of PSVT, hypertension, anxiety and diverticular disease. She says she has been having chronic problems with mild diarrhea since the summer of 2015 after she and her husband both became ill [place] they were at the [facility]. He says her husband recovered that she has had persistent problems with diarrhea. She had undergone 2 different sets of stool cultures around that time all of which were negative and had been treated with a course of Cipro and Flagyl empirically. She came to see Dr. Juanda Chance and ultimately underwent a sigmoidoscopy on 04/22/2014 to rule out microscopic colitis. She was noted to have moderate diverticulosis and random biopsies were taken. She had been placed empirically on a trial of Colestid 2 g daily over the past month. She says she's not noticed any change in her symptoms with Colestid. She was also given a course of Xifaxan but she says she only took this for a few days and was told to stop when she started the Questran. Random biopsies have returned negative. Patient comes in today because of acute onset of rectal bleeding over the weekend. She says she was awakened in the early morning hours Sunday morning with gas and a heavy crampy feeling in her abdomen and urge for bowel movement. She went to the bathroom and had a bowel movement with a large blood clot and obvious blood mixed in. She had another episode a few hours later. She says she did have some mild nausea and felt weak with this episode no distinct diaphoresis. She did not have any further bleeding yesterday but says she didn't feel good  all day and then today has had 2 small bowel movements with bright red blood but no clots. She says she feels better today than she did yesterday has no complaint of lightheadedness or dizziness. He says she has had some persistent soreness which is been mild since she had the sigmoidoscopy but says she was much more uncomfortable on Sunday evening.  Review of Systems Pertinent positive and negative review of systems were noted in the above HPI section.  All other review of systems was otherwise negative.  Outpatient Encounter Prescriptions as of 05/04/2014  Medication Sig  . aspirin 81 MG tablet Take 81 mg by mouth daily.    . Cholecalciferol (VITAMIN D3) 2000 UNITS capsule Take 2,000 Units by mouth daily.  . clopidogrel (PLAVIX) 75 MG tablet Take 75 mg by mouth daily.    Marland Kitchen denosumab (PROLIA) 60 MG/ML SOLN injection Inject 60 mg into the skin every 6 (six) months. Administer in upper arm, thigh, or abdomen  . desoximetasone (TOPICORT) 0.25 % cream Apply 1 application topically 2 (two) times daily. Apply rectally twice daily.  Marland Kitchen diltiazem (CARDIZEM CD) 180 MG 24 hr capsule Take 180 mg by mouth daily.    . DULoxetine (CYMBALTA) 60 MG capsule Take 60 mg by mouth at bedtime.  Marland Kitchen losartan (COZAAR) 100 MG tablet Take 100 mg by mouth at bedtime.  . metoprolol (LOPRESSOR) 50 MG tablet Take 25 mg by mouth 2 (two) times daily.   Marland Kitchen  Multiple Vitamin (MULTIVITAMIN) tablet Take 1 tablet by mouth as needed.   . [DISCONTINUED] desoximetasone (TOPICORT) 0.25 % cream Apply 1 application topically 2 (two) times daily.  Marland Kitchen. MICRONIZED COLESTIPOL HCL 1 G tablet TAKE 1 TABLET BY MOUTH TWICE DAILY(DISCONTINUE XIFAXAN) (Patient not taking: Reported on 04/22/2014)  . [DISCONTINUED] rifaximin (XIFAXAN) 550 MG TABS tablet Take 1 tablet (550 mg total) by mouth 2 (two) times daily.   Allergies  Allergen Reactions  . Sulfonamide Derivatives Shortness Of Breath   Patient Active Problem List   Diagnosis Date Noted  .  HYPERTENSION 09/15/2009  . Seasonal and perennial allergic rhinitis 07/28/2008  . HYPERLIPIDEMIA 05/06/2008  . CEREBROVASCULAR DISEASE 05/06/2008  . DIVERTICULOSIS, COLON 05/06/2008  . CONSTIPATION 05/06/2008  . IRRITABLE BOWEL SYNDROME 05/06/2008   History   Social History  . Marital Status: Married    Spouse Name: N/A    Number of Children: 2  . Years of Education: N/A   Occupational History  . retired    Social History Main Topics  . Smoking status: Never Smoker   . Smokeless tobacco: Never Used  . Alcohol Use: 0.6 oz/week    1 Glasses of wine per week     Comment: occasional use  . Drug Use: No  . Sexual Activity: Yes    Birth Control/ Protection: Post-menopausal   Other Topics Concern  . Not on file   Social History Narrative    Ms. Hollander's family history includes Cerebral aneurysm in her daughter; Heart disease in her father, mother, and sister. There is no history of Colon cancer.      Objective:    Filed Vitals:   05/04/14 1438  BP: 150/70  Pulse: 68    Physical Exam  well-developed elderly white female in no acute distress, very pleasant blood pressure 150/70 pulse 68 height 5 foot 1 weight 109. HEENT; nontraumatic normocephalic EOMI PERRLA sclera anicteric, Supple ;no JVD, Cardiovascular ;regular rate and rhythm with S1-S2 no murmur or gallop, Pulmonary ;clear bilaterally, Abdomen ;soft she is tender in the left upper and left mid quadrant there is no guarding or rebound no palpable mass or hepatosplenomegaly bowel sounds are present, Rectal ;exam no external lesion noted nontender to digital exam she has dark burgundy-stool grossly heme positive, Extremities ;no clubbing cyanosis or edema skin warm and dry, Psych; mood and affect appropriate       Assessment & Plan:   #1 81 yo female with acute onset of left abdominal discomfort then rectal bleeding with passage of large clot on 1/31, persistent low volume bleeding earlier today -I doubt this is from  biopsies which were taken 12 days ago, rule out segmental ischemic colitis rule out segmental colitis associated with diverticular disease both exacerbated by chronic antiplatelet therapy. #2 chronic Plavix and aspirin #3 history of TIA 2010 #4  anxiety #5 IBS  Plan; CBC this afternoon Patient will hold Plavix over the next couple of days until bleeding resolves Schedule for CT scan of the abdomen and pelvis in a.m. Tomorrow, then have her come back by the office to repeat CBC to ensure stability. I do not think she needs to be hospitalized at this time however she is advised that if she has any larger volume bleeding that she should proceed to the emergency room for evaluation. She will go home to rest this evening Stop Questran as no benefit Will likely benefit from finishing a course of Xifaxan however will wait until we have her bleeding sorted out first. #  6 hypertension  Amy S Esterwood PA-C 05/04/2014

## 2014-05-05 ENCOUNTER — Ambulatory Visit (INDEPENDENT_AMBULATORY_CARE_PROVIDER_SITE_OTHER)
Admission: RE | Admit: 2014-05-05 | Discharge: 2014-05-05 | Disposition: A | Discharge status: Home / Self Care | Payer: 59 | Source: Ambulatory Visit | Attending: Physician Assistant | Admitting: Physician Assistant

## 2014-05-05 ENCOUNTER — Other Ambulatory Visit: Payer: Self-pay

## 2014-05-05 ENCOUNTER — Ambulatory Visit: Payer: 59 | Admitting: Physician Assistant

## 2014-05-05 ENCOUNTER — Other Ambulatory Visit (INDEPENDENT_AMBULATORY_CARE_PROVIDER_SITE_OTHER): Payer: 59

## 2014-05-05 DIAGNOSIS — K625 Hemorrhage of anus and rectum: Secondary | ICD-10-CM

## 2014-05-05 DIAGNOSIS — D62 Acute posthemorrhagic anemia: Secondary | ICD-10-CM

## 2014-05-05 DIAGNOSIS — R109 Unspecified abdominal pain: Secondary | ICD-10-CM

## 2014-05-05 LAB — CBC WITH DIFFERENTIAL/PLATELET
Basophils Absolute: 0 10*3/uL (ref 0.0–0.1)
Basophils Relative: 0.6 % (ref 0.0–3.0)
EOS PCT: 0.5 % (ref 0.0–5.0)
Eosinophils Absolute: 0 10*3/uL (ref 0.0–0.7)
HCT: 31.3 % — ABNORMAL LOW (ref 36.0–46.0)
HEMOGLOBIN: 10.7 g/dL — AB (ref 12.0–15.0)
LYMPHS ABS: 1.5 10*3/uL (ref 0.7–4.0)
Lymphocytes Relative: 20.4 % (ref 12.0–46.0)
MCHC: 34.3 g/dL (ref 30.0–36.0)
MCV: 88.5 fl (ref 78.0–100.0)
Monocytes Absolute: 0.6 10*3/uL (ref 0.1–1.0)
Monocytes Relative: 8.4 % (ref 3.0–12.0)
NEUTROS PCT: 70.1 % (ref 43.0–77.0)
Neutro Abs: 5.1 10*3/uL (ref 1.4–7.7)
Platelets: 266 10*3/uL (ref 150.0–400.0)
RBC: 3.53 Mil/uL — ABNORMAL LOW (ref 3.87–5.11)
RDW: 13.2 % (ref 11.5–15.5)
WBC: 7.3 10*3/uL (ref 4.0–10.5)

## 2014-05-05 MED ORDER — IOHEXOL 300 MG/ML  SOLN
100.0000 mL | Freq: Once | INTRAMUSCULAR | Status: AC | PRN
  Administered 2014-05-05: 100 mL via INTRAVENOUS

## 2014-05-06 ENCOUNTER — Other Ambulatory Visit (INDEPENDENT_AMBULATORY_CARE_PROVIDER_SITE_OTHER): Payer: 59

## 2014-05-06 DIAGNOSIS — D62 Acute posthemorrhagic anemia: Secondary | ICD-10-CM

## 2014-05-06 LAB — CBC WITH DIFFERENTIAL/PLATELET
Basophils Absolute: 0 10*3/uL (ref 0.0–0.1)
Basophils Relative: 0.5 % (ref 0.0–3.0)
EOS ABS: 0 10*3/uL (ref 0.0–0.7)
EOS PCT: 0.7 % (ref 0.0–5.0)
HCT: 29.9 % — ABNORMAL LOW (ref 36.0–46.0)
Hemoglobin: 10.3 g/dL — ABNORMAL LOW (ref 12.0–15.0)
LYMPHS ABS: 1.8 10*3/uL (ref 0.7–4.0)
LYMPHS PCT: 29.3 % (ref 12.0–46.0)
MCHC: 34.5 g/dL (ref 30.0–36.0)
MCV: 88.3 fl (ref 78.0–100.0)
MONO ABS: 0.6 10*3/uL (ref 0.1–1.0)
MONOS PCT: 9.8 % (ref 3.0–12.0)
NEUTROS ABS: 3.7 10*3/uL (ref 1.4–7.7)
Neutrophils Relative %: 59.7 % (ref 43.0–77.0)
Platelets: 251 10*3/uL (ref 150.0–400.0)
RBC: 3.38 Mil/uL — AB (ref 3.87–5.11)
RDW: 13.1 % (ref 11.5–15.5)
WBC: 6.2 10*3/uL (ref 4.0–10.5)

## 2014-05-08 ENCOUNTER — Other Ambulatory Visit (INDEPENDENT_AMBULATORY_CARE_PROVIDER_SITE_OTHER): Payer: 59

## 2014-05-08 DIAGNOSIS — D62 Acute posthemorrhagic anemia: Secondary | ICD-10-CM

## 2014-05-08 LAB — CBC WITH DIFFERENTIAL/PLATELET
BASOS ABS: 0.1 10*3/uL (ref 0.0–0.1)
BASOS PCT: 1 % (ref 0.0–3.0)
EOS PCT: 0.6 % (ref 0.0–5.0)
Eosinophils Absolute: 0 10*3/uL (ref 0.0–0.7)
HCT: 30.9 % — ABNORMAL LOW (ref 36.0–46.0)
HEMOGLOBIN: 10.3 g/dL — AB (ref 12.0–15.0)
LYMPHS PCT: 26 % (ref 12.0–46.0)
Lymphs Abs: 1.6 10*3/uL (ref 0.7–4.0)
MCHC: 33.3 g/dL (ref 30.0–36.0)
MCV: 90.4 fl (ref 78.0–100.0)
MONOS PCT: 10.3 % (ref 3.0–12.0)
Monocytes Absolute: 0.6 10*3/uL (ref 0.1–1.0)
NEUTROS PCT: 62.1 % (ref 43.0–77.0)
Neutro Abs: 3.7 10*3/uL (ref 1.4–7.7)
Platelets: 277 10*3/uL (ref 150.0–400.0)
RBC: 3.42 Mil/uL — ABNORMAL LOW (ref 3.87–5.11)
RDW: 12.9 % (ref 11.5–15.5)
WBC: 6 10*3/uL (ref 4.0–10.5)

## 2014-05-11 ENCOUNTER — Ambulatory Visit: Payer: 59 | Admitting: Physician Assistant

## 2014-06-19 ENCOUNTER — Other Ambulatory Visit (HOSPITAL_COMMUNITY): Payer: Self-pay | Admitting: Interventional Radiology

## 2014-06-19 DIAGNOSIS — I872 Venous insufficiency (chronic) (peripheral): Secondary | ICD-10-CM

## 2014-06-25 ENCOUNTER — Ambulatory Visit
Admission: RE | Admit: 2014-06-25 | Discharge: 2014-06-25 | Disposition: A | Discharge status: Home / Self Care | Payer: Medicare Other | Source: Ambulatory Visit | Attending: Interventional Radiology | Admitting: Interventional Radiology

## 2014-06-25 DIAGNOSIS — I872 Venous insufficiency (chronic) (peripheral): Secondary | ICD-10-CM | POA: Insufficient documentation

## 2014-06-25 NOTE — Progress Notes (Addendum)
Chief Complaint: Recurrent symptomatic varicosities of the left lower extremity.  Referring Physician(s): Franciso Dierks  History of Present Illness: [name] West is a 81 y.o. female with a history of venous insufficiency of the left great saphenous vein supplying symptomatic left lower extremity varicosities. [name] West is status post prior endovenous laser occlusion of the left great saphenous vein on 07/04/2007. Additional foam sclerotherapy was performed of varicosities on 01/21/2008. Endovenous laser occlusion of the left GSV was repeated on 10/23/2012 due to recanalization.  The patient has now noticed recurrent bulging varicosities of the medial left thigh and knee which are now symptomatic and cause pain mostly at night and also during the day. She also has restless leg symptoms at night which awaken her from sleep. She has been wearing compression garments regularly over the last 2 years. The garments have not improved her symptoms after no varicosities were noticed over the last several months. She has not noticed any significant edema of the extremity. She denies ulcerations or erythema.  Past Medical History  Diagnosis Date  . Diverticulosis of colon (without mention of hemorrhage)   . Irritable bowel syndrome   . Cerebrovascular disease, unspecified   . Other and unspecified hyperlipidemia   . History of shingles   . Allergic rhinitis   . Hypertension   . Stroke     Hx of strokes  . Asthma   . Interstitial cystitis   . Stress disorder, acute   . Hyponatremia   . Osteoporosis   . PSVT (paroxysmal supraventricular tachycardia)   . Vitamin D deficiency   . Anxiety   . Postherpetic neuralgia   . Anemia   . DJD (degenerative joint disease)   . Chronic insomnia   . Irritable bowel syndrome with diarrhea     Past Surgical History  Procedure Laterality Date  . Appendectomy    . Tonsillectomy    . Dilation and curettage of uterus      Allergies: Sulfonamide  derivatives  Medications: Prior to Admission medications   Medication Sig Start Date End Date Taking? Authorizing Provider  aspirin 81 MG tablet Take 81 mg by mouth daily.      Historical Provider, MD  Cholecalciferol (VITAMIN D3) 2000 UNITS capsule Take 2,000 Units by mouth daily.    Historical Provider, MD  clopidogrel (PLAVIX) 75 MG tablet Take 75 mg by mouth daily.      Historical Provider, MD  denosumab (PROLIA) 60 MG/ML SOLN injection Inject 60 mg into the skin every 6 (six) months. Administer in upper arm, thigh, or abdomen    Historical Provider, MD  desoximetasone (TOPICORT) 0.25 % cream Apply 1 application topically 2 (two) times daily. Apply rectally twice daily. 05/04/14   Amy S Esterwood, PA-C  diltiazem (CARDIZEM CD) 180 MG 24 hr capsule Take 180 mg by mouth daily.      Historical Provider, MD  DULoxetine (CYMBALTA) 60 MG capsule Take 60 mg by mouth at bedtime.    Historical Provider, MD  losartan (COZAAR) 100 MG tablet Take 100 mg by mouth at bedtime.    Historical Provider, MD  metoprolol (LOPRESSOR) 50 MG tablet Take 25 mg by mouth 2 (two) times daily.     Historical Provider, MD  MICRONIZED COLESTIPOL HCL 1 G tablet TAKE 1 TABLET BY MOUTH TWICE DAILY(DISCONTINUE [employer]) Patient not taking: Reported on 04/22/2014 04/21/14   Hart Carwin, MD  Multiple Vitamin (MULTIVITAMIN) tablet Take 1 tablet by mouth as needed.     Historical Provider,  MD     Family History  Problem Relation Age of Onset  . Heart disease Father   . Heart disease Sister   . 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 Colon cancer Neg Hx   . Heart disease Mother     History   Social History  . Marital Status: Married    Spouse Name: N/A  . Number of Children: 2  . Years of Education: N/A   Occupational History  . retired    Social History Main Topics  . Smoking status: Never Smoker   . Smokeless tobacco: Never Used  . Alcohol Use: 0.6 oz/week    1  Glasses of wine per week     Comment: occasional use  . Drug Use: No  . Sexual Activity: Yes    Birth Control/ Protection: Post-menopausal   Other Topics Concern  . Not on file   Social History Narrative    Review of Systems: A 12 point ROS discussed and pertinent positives are indicated in the HPI above.  All other systems are negative.  Review of Systems  Constitutional: Negative.   Musculoskeletal: Negative.   Skin: Negative.     Vital Signs: There were no vitals taken for this visit.  Physical Exam  Constitutional: No distress.  Musculoskeletal:  Tortuous, bulging and palpable varicosities of the medial mid to distal thigh that cross the knee.  No erythema, focal tenderness, ulcerations or skin changes.  No significant edema.  No hyperpigmentation or lipodermatosclerosis.  Arterial perfusion to left leg is intact.  Skin: She is not diaphoretic.  Nursing note and vitals reviewed.   Mallampati Score:     Imaging: US Venous Img Lower Unilateral Left  06/25/2014   CLINICAL DATA:  History of venous insufficiency of the left great saphenous vein supplying symptomatic left lower extremity varicosities. [name] West is status post prior endovenous laser occlusion of the left great saphenous vein on 07/04/2007. Additional foam sclerotherapy was performed of varicosities on 01/21/2008. Endo venous laser occlusion of the left GSV was repeated on 10/23/2012 due to recanalization.  EXAM: LEFT LOWER EXTREMITY VENOUS DUPLEX ULTRASOUND  TECHNIQUE: Gray-scale sonography with graded compression, as well as color Doppler and duplex ultrasound, were performed to evaluate the deep and superficial veins of the lower extremity. Spectral Doppler was utilized to evaluate flow at rest and with distal augmentation maneuvers. I personally performed the technical portion of the exam.  COMPARISON:  Multiple prior studies. The last venous evaluation of the left lower extremity is dated 11/26/2012.   FINDINGS: Deep venous evaluation shows widely patent deep veins without evidence of DVT. All veins are normally compressible. No thrombus is identified at the saphenofemoral junction. No evidence of deep venous reflux.  Superficial venous evaluation shows recanalization of the left great saphenous vein beginning at the saphenofemoral junction and continuing into the distal thigh. The proximal GSV measures 9 mm in diameter. The vein measures 5 mm in diameter at the mid thigh level and 6 mm in the distal thigh. The recanalized segment of the GSV shows incompetence and reflux with greater than 3 seconds of reflux at the level of the saphenofemoral junction. Mid thigh shows approximately 2.2 seconds of reflux in the distal thigh demonstrates 2.1 seconds of reflux.  The great saphenous vein terminates in the distal thigh with a superficial branch supplying multiple patent distal thigh varicosities that also across the knee joint. This branch vein shows sustained reflux with augmentation  of greater than 2 seconds. Varicosities reach a maximum caliber of approximately 6-7 mm. There is no evidence of thrombus in open varicosities.  The GSV is non identifiable and chronically occluded across the knee and throughout the calf. A single perforator vein is demonstrated in the distal calf which demonstrates no significant reflux with augmentation. Short saphenous evaluation demonstrates no evidence of incompetence. The short saphenous vein is tiny and almost non-detectable by ultrasound.  IMPRESSION: Recanalization of the great saphenous vein in the thigh from the level of the SFV to the distal thigh. The recanalized segment is diffusely incompetent. The GSV terminates in a superficial incompetent branch vein that supplies a prominent network of varicosities in the distal thigh, crossing the knee and extending just into the proximal thigh. Significant sustained reflux is noted with augmentation at the level of the recanalized GSV  (3 seconds), superficial vein and varicosities (>2 seconds). The proximal GSV measures 9 mm in diameter. The GSV across the knee and throughout the calf is chronically occluded after prior laser therapy.   Electronically Signed   By: Irish Lack M.D.   On: 06/25/2014 11:32    Labs:  CBC:  Recent Labs  05/04/14 1601 05/05/14 1107 05/06/14 1458 05/08/14 1247  WBC 6.9 7.3 6.2 6.0  HGB 11.2* 10.7* 10.3* 10.3*  HCT 33.2* 31.3* 29.9* 30.9*  PLT 283.0 266.0 251.0 277.0    COAGS: No results for input(s): INR, APTT in the last 8760 hours.  BMP:  Recent Labs  05/04/14 1601  NA 133*  K 4.3  CL 99  CO2 30  GLUCOSE 94  BUN 14  CALCIUM 8.8  CREATININE 0.62    LIVER FUNCTION TESTS: No results for input(s): BILITOT, AST, ALT, ALKPHOS, PROT, ALBUMIN in the last 8760 hours.  TUMOR MARKERS: No results for input(s): AFPTM, CEA, CA199, CHROMGRNA in the last 8760 hours.  Assessment and Plan:  Recurrent symptomatic varicose veins of the left lower extremity. Ultrasound demonstrates recanalization of the upper left great saphenous vein from the saphenofemoral junction into the distal thigh. The GSV terminates in the distal thigh and supplies an incompetent superficial branch that supplies a prominent network of tortuous varicosities in the thigh that also across the knee and extending just into the proximal calf. The GSV at the level of the knee and calf is chronically occluded and not visible by ultrasound. Ultrasound demonstrates sustained insufficiency and reflux of greater than 3 seconds at the saphenofemoral junction and greater than 2 seconds in the rest of the GSV and at the level of the superficial incompetent supply to varicosities. The proximal great saphenous vein measures 9 mm.  Due to recurrent symptoms of pain, the patient is a candidate for retreatment of the left great saphenous vein by laser occlusion and sclerotherapy of recurrent varicosities. Since her prior treatment  in 2014, a newer and more powerful 1470 nm endovenous laser has been obtained and demonstrates more durable occlusion over time compared to the older 600 nm system.  Current CEAP classification of the left lower extremity is C2,s Ep As Pr. The patient would like to proceed with retreatment of the left great saphenous vein with laser occlusion. Foam sclerotherapy is also indicated at that time to treat the symptomatic varicosities.  SignedIrish Lack T 06/25/2014, 11:43 AM     I spent a total of 20 minutes in face to face in clinical consultation, greater than 50% of which was counseling/coordinating care for left lower extremity venous insufficiency and symptomatic varicose veins.  ADDENDUM (Dictated on 07/16/2014):  Additional information has been requested from the patient's insurance carrier.   The patient weighs 109 pounds.   She has clearly failed conservative therapy with regular use of compression garments over the last nearly 2 years after her most recent treatment.   The patient clearly has significant pain that is affecting activities of daily living. She has pain throughout the day that limits her ability to walk. She is also awoken from sleep at night with lower extremity pain.   Sclerotherapy is indicated at the level of symptomatic left thigh varicosities, left knee varicosities and proximal calf varicosities.   Endovenous laser occlusion of the left great saphenous vein is indicated due to sustained insufficiency and reflux lasting greater than 3 seconds with augmentation. Despite the presence of tortuous varicosities, there is a continuous, open segment of the great saphenous vein extending from the distal thigh to the level of the saphenofemoral junction that does not contain thrombus and that will allow safe and unimpaired sheath advancement for endovenous laser occlusion.

## 2014-08-04 ENCOUNTER — Other Ambulatory Visit (HOSPITAL_COMMUNITY): Payer: Self-pay | Admitting: Interventional Radiology

## 2014-08-05 ENCOUNTER — Other Ambulatory Visit (HOSPITAL_COMMUNITY): Payer: Self-pay | Admitting: Interventional Radiology

## 2014-08-05 DIAGNOSIS — I872 Venous insufficiency (chronic) (peripheral): Secondary | ICD-10-CM

## 2014-08-07 ENCOUNTER — Other Ambulatory Visit: Payer: Self-pay | Admitting: Interventional Radiology

## 2014-08-07 DIAGNOSIS — I872 Venous insufficiency (chronic) (peripheral): Secondary | ICD-10-CM

## 2014-08-21 ENCOUNTER — Other Ambulatory Visit: Payer: Self-pay | Admitting: Interventional Radiology

## 2014-08-21 DIAGNOSIS — I872 Venous insufficiency (chronic) (peripheral): Secondary | ICD-10-CM

## 2014-09-02 ENCOUNTER — Other Ambulatory Visit: Payer: Self-pay | Admitting: Interventional Radiology

## 2014-09-02 DIAGNOSIS — I872 Venous insufficiency (chronic) (peripheral): Secondary | ICD-10-CM

## 2014-09-28 ENCOUNTER — Other Ambulatory Visit: Payer: Self-pay

## 2014-09-30 ENCOUNTER — Ambulatory Visit
Admission: RE | Admit: 2014-09-30 | Discharge: 2014-09-30 | Disposition: A | Discharge status: Home / Self Care | Payer: Medicare Other | Source: Ambulatory Visit | Attending: Interventional Radiology | Admitting: Interventional Radiology

## 2014-09-30 ENCOUNTER — Other Ambulatory Visit (HOSPITAL_COMMUNITY): Payer: Self-pay | Admitting: Interventional Radiology

## 2014-09-30 ENCOUNTER — Other Ambulatory Visit: Payer: Self-pay | Admitting: Interventional Radiology

## 2014-09-30 VITALS — BP 155/64 | HR 68 | Temp 98.1°F | Resp 15

## 2014-09-30 DIAGNOSIS — I872 Venous insufficiency (chronic) (peripheral): Secondary | ICD-10-CM

## 2014-09-30 DIAGNOSIS — I83029 Varicose veins of left lower extremity with ulcer of unspecified site: Secondary | ICD-10-CM

## 2014-09-30 DIAGNOSIS — L97929 Non-pressure chronic ulcer of unspecified part of left lower leg with unspecified severity: Principal | ICD-10-CM

## 2014-09-30 MED ORDER — DIAZEPAM 10 MG PO TABS: 10.0000 mg | ORAL_TABLET | Freq: Once | ORAL | Status: DC

## 2014-09-30 NOTE — Progress Notes (Signed)
[id number]  Informed consent obtained.  Patient states that she understands.  Patient and her daughter given verbal & written discharge instructions.    1000  Valium 10 mg po (per Dr Fredia SorrowYamagata).  1005  22 gauge x 1" insyte catheter started IV Right antecubital (1st attempt) w/ saline lock and 7" extension.  Flushed with 10 mL NSS without difficulty.  Site unremarkable.  1050  Procedure started.    1240  Procedure completed.  Wearing thigh high graduated compression garment, 20-30 mm Hg on LLE.    1250  IV discontinued Right antecubital, catheter intact.  Site unremarkable.  1305  Ambulated x 10 minutes w/ minimal assistance.  Tolerated well.   1315  Reviewed discharge instructions.  Daughter present to provide transportation.  1320  Discharged to home.    [name] West, [place] 09/30/2014 3:29 PM

## 2014-10-08 ENCOUNTER — Ambulatory Visit
Admission: RE | Admit: 2014-10-08 | Discharge: 2014-10-08 | Disposition: A | Discharge status: Home / Self Care | Payer: Medicare Other | Source: Ambulatory Visit | Attending: Interventional Radiology | Admitting: Interventional Radiology

## 2014-10-08 DIAGNOSIS — I872 Venous insufficiency (chronic) (peripheral): Secondary | ICD-10-CM

## 2014-10-08 NOTE — Consult Note (Signed)
Chief Complaint: One week follow-up after endovascular laser treatment and sclerotherapy to the left lower extremity.  Referring Physician(s): Yamagata,Glenn  History of Present Illness: [name] West is a 81 y.o. female with history of left lower extremity venous insufficiency. The patient had a recannulized left great saphenous vein and multiple patent varicosities in the left lower extremity. The patient underwent ultrasound-guided endovascular laser occlusion of the left thigh great saphenous vein with sclerotherapy to varicose veins in the left calf on 09/30/2014.  The patient tolerated the procedure very well. She denies fevers or chills. Minimal pain at the puncture sites. Patient's pain and discomfort in the left lower extremity has already started to improve. She has been wearing the compression stockings continuously during the day. She does not sleep in the stockings. Overall, the patient is very happy with the early results of the procedure. In addition, she says that the bulky varicosities in the medial distal thigh have decreased in size.  Past Medical History  Diagnosis Date  . Diverticulosis of colon (without mention of hemorrhage)   . Irritable bowel syndrome   . Cerebrovascular disease, unspecified   . Other and unspecified hyperlipidemia   . History of shingles   . Allergic rhinitis   . Hypertension   . Stroke     Hx of strokes  . Asthma   . Interstitial cystitis   . Stress disorder, acute   . Hyponatremia   . Osteoporosis   . PSVT (paroxysmal supraventricular tachycardia)   . Vitamin D deficiency   . Anxiety   . Postherpetic neuralgia   . Anemia   . DJD (degenerative joint disease)   . Chronic insomnia   . Irritable bowel syndrome with diarrhea     Past Surgical History  Procedure Laterality Date  . Appendectomy    . Tonsillectomy    . Dilation and curettage of uterus      Allergies: Sulfonamide derivatives  Medications: Prior to Admission  medications   Medication Sig Start Date End Date Taking? Authorizing Provider  aspirin 81 MG tablet Take 81 mg by mouth daily.      Historical Provider, MD  Cholecalciferol (VITAMIN D3) 2000 UNITS capsule Take 2,000 Units by mouth daily.    Historical Provider, MD  clopidogrel (PLAVIX) 75 MG tablet Take 75 mg by mouth daily.      Historical Provider, MD  denosumab (PROLIA) 60 MG/ML SOLN injection Inject 60 mg into the skin every 6 (six) months. Administer in upper arm, thigh, or abdomen    Historical Provider, MD  desoximetasone (TOPICORT) 0.25 % cream Apply 1 application topically 2 (two) times daily. Apply rectally twice daily. 05/04/14   Amy S Esterwood, PA-C  diazepam (VALIUM) 10 MG tablet Take 1 tablet (10 mg total) by mouth once. 09/30/14   Irish Lack, MD  diltiazem (CARDIZEM CD) 180 MG 24 hr capsule Take 180 mg by mouth daily.      Historical Provider, MD  DULoxetine (CYMBALTA) 60 MG capsule Take 60 mg by mouth at bedtime.    Historical Provider, MD  losartan (COZAAR) 100 MG tablet Take 100 mg by mouth at bedtime.    Historical Provider, MD  metoprolol (LOPRESSOR) 50 MG tablet Take 25 mg by mouth 2 (two) times daily.     Historical Provider, MD  MICRONIZED COLESTIPOL HCL 1 G tablet TAKE 1 TABLET BY MOUTH TWICE DAILY(DISCONTINUE [employer]) Patient not taking: Reported on 04/22/2014 04/21/14   Hart Carwin, MD  Multiple Vitamin (MULTIVITAMIN) tablet  Take 1 tablet by mouth as needed.     Historical Provider, MD     Family History  Problem Relation Age of Onset  . Heart disease Father   . Heart disease Sister   . 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 Colon cancer Neg Hx   . Heart disease Mother     History   Social History  . Marital Status: Married    Spouse Name: N/A  . Number of Children: 2  . Years of Education: N/A   Occupational History  . retired    Social History Main Topics  . Smoking status: Never Smoker   .  Smokeless tobacco: Never Used  . Alcohol Use: 0.6 oz/week    1 Glasses of wine per week     Comment: occasional use  . Drug Use: No  . Sexual Activity: Yes    Birth Control/ Protection: Post-menopausal   Other Topics Concern  . Not on file   Social History Narrative     Review of Systems  Constitutional: Negative for fever and chills.    Vital Signs: There were no vitals taken for this visit.  Physical Exam  Musculoskeletal:  Mild bruising in the medial distal left thigh and medial calf.  No erythema.  Punctures sites are healing.  No skin breakdown.  Visible varicose veins in left medial distal thigh.  No left ankle swelling.         Imaging: CLINICAL DATA: One week follow-up after ultrasound-guided foam sclerotherapy and endovenous laser occlusion in the left lower extremity.  EXAM: LEFT LOWER EXTREMITY VENOUS DUPLEX ULTRASOUND  TECHNIQUE: Gray-scale sonography with graded compression, as well as color Doppler and duplex ultrasound, were performed to evaluate the deep and superficial veins in the left lower extremity. Spectral Doppler was utilized to evaluate flow at rest and with distal augmentation maneuvers. A superficial venous exam was performed. I personally performed the technical portion of the exam.  COMPARISON: None.  FINDINGS: Deep venous system: Normal compressibility, augmentation and color Doppler flow in the left common femoral vein, left femoral vein and left popliteal vein. Left posterior tibial veins are patent in the distal calf.  Superficial venous system: The left saphenofemoral junction is patent. The left great saphenous vein is occluded from the proximal thigh to the distal thigh. Varicosities in the medial distal thigh and knee region are non compressible and thrombosed. Varicosities in the proximal calf are thrombosed.  IMPRESSION: Successful treatment of the left great saphenous vein and varicose veins in the left  lower extremity. The treated vessels are thrombosed.  Negative for deep vein thrombosis in the left lower extremity.   Electronically Signed  By: Richarda Overlie M.D.  On: 10/08/2014 12:16  [place] Injec Sclerotherapy Single  09/30/2014   CLINICAL DATA:  Recurrent symptomatic varicosities of the left lower extremity secondary to recanalization of the left great saphenous vein after prior endo venous laser occlusion.  EXAM: 1. ULTRASOUND-GUIDED FOAM SCLEROTHERAPY OF LEFT CALF AND KNEE VARICOSITIES 2. ENDOVASCULAR LASER OCCLUSION OF THE LEFT GREAT SAPHENOUS VEIN.  FLUOROSCOPY TIME:  None  MEDICATIONS AND MEDICAL HISTORY: 10 MG ORAL VALIUM ADMINISTERED PRIOR TO THE PROCEDURE.  ANESTHESIA/SEDATION: LOCAL ANESTHESIA WITH 1% LIDOCAINE  PROCEDURE: The procedure was explained to the patient. The risks and benefits of the procedure were discussed and that patient's questions were addressed. Informed consent was obtained from the patient.  The patient was placed in a supine position. The left lower  extremity superficial venous system was evaluated with ultrasound. The left great saphenous vein was identified and the vein path was marked on the skin. The left leg and left groin were prepped with Betadine and a sterile drape was placed.  A left calf varicosity was accessed with a 21 gauge needle under direct ultrasound guidance. A guidewire was advanced. A micropuncture dilator was advanced over the wire. The dilator was flushed and a stopcock applied to the hub of the dilator.  Under ultrasound guidance, a 21 gauge needle was advanced into the left great saphenous vein in the distal thigh. After securing access with a guidewire, a micropuncture dilator was advanced. A 0.035 inch guide wire was advanced to the saphenofemoral junction with ultrasound guidance. A 65 cm, 4 French sheath was placed over the wire and advanced beyond the saphenofemoral junction. A laser fiber was advanced through the sheath and advanced just  beyond the tip of the sheath. The sheath was then retracted and distance between the laser fiber tip and saphenofemoral junction was measured and documented by ultrasound.  A mixture of 1.5 mL 1% STS was then foamed with an equal volume of air. This foamed sclerosant was then administered through the micropuncture dilator positioned in the calf varicosities. Ultrasound was used to confirm dispersal of sclerosant. An additional 0.5 mL of 1% STS was then foamed with air injected via a 25 gauge needle under ultrasound guidance into a varicosity overlying the medial knee region.  Dilute lidocaine was used as a tumescent and administered under ultrasound guidance with a 22 gauge spinal needle automated infusion pump. The great saphenous vein from the percutaneous entry site to the saphenofemoral junction was treated with 300 ml of tumescent. The laser treatment was performed with 1,079 joules over 108 seconds over a 25 cm segment. The sheath and laser were removed as a single entity. Manual compression was placed over the percutaneous entry site. Dressings were placed over the puncture sites and the leg was placed in a compression stocking.  COMPLICATIONS: None  FINDINGS: In the calf, a well-defined segment of the great saphenous vein was not able to be delineated by ultrasound. Instead, a complex network of varicosities is present which ultimately communicate with the great saphenous vein in the distal thigh and also appear to communicate with a segment of the short saphenous vein. The great saphenous vein was only able to be successfully accessed in the distal thigh. This allowed laser treatment of a 25 cm segment extending from the distal thigh up to just below the saphenofemoral junction. The endo venous laser tip was positioned 2.2 cm from the saphenofemoral junction prior to laser occlusion.  Foam sclerotherapy was performed of varicosities at the level of the knee and calf. Ultrasound shows excellent dispersal of  sclerosing throughout the network of varicosities.  IMPRESSION: 1. Ultrasound-guided foam sclerotherapy performed of a complex network of varicosities in the calf and overlying the medial knee. 2. Ultrasound-guided endo venous laser occlusion of the great saphenous vein was performed over a 25 cm segment extending from the distal thigh up to 2.2 cm below the saphenofemoral junction.   Electronically Signed   By: Irish Lack M.D.   On: 09/30/2014 16:51   Ir Embo Venous Not Hemorr [employer] Guide Roadmapping  09/30/2014   CLINICAL DATA:  Recurrent symptomatic varicosities of the left lower extremity secondary to recanalization of the left great saphenous vein after prior endo venous laser occlusion.  EXAM: 1. ULTRASOUND-GUIDED FOAM SCLEROTHERAPY OF LEFT  CALF AND KNEE VARICOSITIES 2. ENDOVASCULAR LASER OCCLUSION OF THE LEFT GREAT SAPHENOUS VEIN.  FLUOROSCOPY TIME:  None  MEDICATIONS AND MEDICAL HISTORY: 10 MG ORAL VALIUM ADMINISTERED PRIOR TO THE PROCEDURE.  ANESTHESIA/SEDATION: LOCAL ANESTHESIA WITH 1% LIDOCAINE  PROCEDURE: The procedure was explained to the patient. The risks and benefits of the procedure were discussed and that patient's questions were addressed. Informed consent was obtained from the patient.  The patient was placed in a supine position. The left lower extremity superficial venous system was evaluated with ultrasound. The left great saphenous vein was identified and the vein path was marked on the skin. The left leg and left groin were prepped with Betadine and a sterile drape was placed.  A left calf varicosity was accessed with a 21 gauge needle under direct ultrasound guidance. A guidewire was advanced. A micropuncture dilator was advanced over the wire. The dilator was flushed and a stopcock applied to the hub of the dilator.  Under ultrasound guidance, a 21 gauge needle was advanced into the left great saphenous vein in the distal thigh. After securing access with a guidewire, a  micropuncture dilator was advanced. A 0.035 inch guide wire was advanced to the saphenofemoral junction with ultrasound guidance. A 65 cm, 4 French sheath was placed over the wire and advanced beyond the saphenofemoral junction. A laser fiber was advanced through the sheath and advanced just beyond the tip of the sheath. The sheath was then retracted and distance between the laser fiber tip and saphenofemoral junction was measured and documented by ultrasound.  A mixture of 1.5 mL 1% STS was then foamed with an equal volume of air. This foamed sclerosant was then administered through the micropuncture dilator positioned in the calf varicosities. Ultrasound was used to confirm dispersal of sclerosant. An additional 0.5 mL of 1% STS was then foamed with air injected via a 25 gauge needle under ultrasound guidance into a varicosity overlying the medial knee region.  Dilute lidocaine was used as a tumescent and administered under ultrasound guidance with a 22 gauge spinal needle automated infusion pump. The great saphenous vein from the percutaneous entry site to the saphenofemoral junction was treated with 300 ml of tumescent. The laser treatment was performed with 1,079 joules over 108 seconds over a 25 cm segment. The sheath and laser were removed as a single entity. Manual compression was placed over the percutaneous entry site. Dressings were placed over the puncture sites and the leg was placed in a compression stocking.  COMPLICATIONS: None  FINDINGS: In the calf, a well-defined segment of the great saphenous vein was not able to be delineated by ultrasound. Instead, a complex network of varicosities is present which ultimately communicate with the great saphenous vein in the distal thigh and also appear to communicate with a segment of the short saphenous vein. The great saphenous vein was only able to be successfully accessed in the distal thigh. This allowed laser treatment of a 25 cm segment extending from  the distal thigh up to just below the saphenofemoral junction. The endo venous laser tip was positioned 2.2 cm from the saphenofemoral junction prior to laser occlusion.  Foam sclerotherapy was performed of varicosities at the level of the knee and calf. Ultrasound shows excellent dispersal of sclerosing throughout the network of varicosities.  IMPRESSION: 1. Ultrasound-guided foam sclerotherapy performed of a complex network of varicosities in the calf and overlying the medial knee. 2. Ultrasound-guided endo venous laser occlusion of the great saphenous vein was performed over  a 25 cm segment extending from the distal thigh up to 2.2 cm below the saphenofemoral junction.   Electronically Signed   By: Irish Lack M.D.   On: 09/30/2014 16:51    Labs:  CBC:  Recent Labs  05/04/14 1601 05/05/14 1107 05/06/14 1458 05/08/14 1247  WBC 6.9 7.3 6.2 6.0  HGB 11.2* 10.7* 10.3* 10.3*  HCT 33.2* 31.3* 29.9* 30.9*  PLT 283.0 266.0 251.0 277.0    COAGS: No results for input(s): INR, APTT in the last 8760 hours.  BMP:  Recent Labs  05/04/14 1601  NA 133*  K 4.3  CL 99  CO2 30  GLUCOSE 94  BUN 14  CALCIUM 8.8  CREATININE 0.62    Assessment and Plan:  81 year old with left lower extremity venous insufficiency. The patient is status post endovascular laser treatment and ultrasound-guided foam sclerotherapy in the left lower extremity. Ultrasound images demonstrate successful treatment of the left great saphenous vein and varicosities in the distal thigh and calf.  The patient's symptoms are resolving and she is very happy with the early results of the procedure.  I recommended that she continue to wear the compression stockings as often as possible. Patient will restart her Plavix.  Patient is scheduled to follow-up with Dr. Fredia Sorrow in approximately one month.    SignedAbundio Miu 10/08/2014, 12:17 PM   I spent a total of  10 Minutes in face to face in clinical  consultation, greater than 50% of which was counseling/coordinating care for left lower extremity venous insufficiency.

## 2014-10-27 ENCOUNTER — Ambulatory Visit
Admission: RE | Admit: 2014-10-27 | Discharge: 2014-10-27 | Disposition: A | Discharge status: Home / Self Care | Payer: 59 | Source: Ambulatory Visit | Attending: Interventional Radiology | Admitting: Interventional Radiology

## 2014-10-27 ENCOUNTER — Ambulatory Visit
Admission: RE | Admit: 2014-10-27 | Discharge: 2014-10-27 | Disposition: A | Discharge status: Home / Self Care | Payer: Medicare Other | Source: Ambulatory Visit | Attending: Interventional Radiology | Admitting: Interventional Radiology

## 2014-10-27 DIAGNOSIS — I872 Venous insufficiency (chronic) (peripheral): Secondary | ICD-10-CM

## 2014-10-27 NOTE — Progress Notes (Signed)
Chief Complaint: Status post endovenous laser occlusion of the left great saphenous vein and foam sclerotherapy of varicosities on 09/30/2014 to treat symptomatic venous insufficiency.  History of Present Illness: [name] West is a 81 y.o. female 4 weeks status post laser occlusion of the great saphenous vein in the thigh and additional foam sclerotherapy of open, symptomatic varicosities extending from the distal thigh, across the knee and into the calf. The patient has done well posttreatment with some complaint of persistent tenderness overlying varicosities, especially in the medial mid calf region. She has had no erythema, bleeding or fever since the procedure. Overall symptoms of discomfort of the left lower extremity have improved since treatment.  Past Medical History  Diagnosis Date  . Diverticulosis of colon (without mention of hemorrhage)   . Irritable bowel syndrome   . Cerebrovascular disease, unspecified   . Other and unspecified hyperlipidemia   . History of shingles   . Allergic rhinitis   . Hypertension   . Stroke     Hx of strokes  . Asthma   . Interstitial cystitis   . Stress disorder, acute   . Hyponatremia   . Osteoporosis   . PSVT (paroxysmal supraventricular tachycardia)   . Vitamin D deficiency   . Anxiety   . Postherpetic neuralgia   . Anemia   . DJD (degenerative joint disease)   . Chronic insomnia   . Irritable bowel syndrome with diarrhea     Past Surgical History  Procedure Laterality Date  . Appendectomy    . Tonsillectomy    . Dilation and curettage of uterus      Allergies: Sulfonamide derivatives  Medications: Prior to Admission medications   Medication Sig Start Date End Date Taking? Authorizing Provider  aspirin 81 MG tablet Take 81 mg by mouth daily.      Historical Provider, MD  Cholecalciferol (VITAMIN D3) 2000 UNITS capsule Take 2,000 Units by mouth daily.    Historical Provider, MD  clopidogrel (PLAVIX) 75 MG tablet Take  75 mg by mouth daily.      Historical Provider, MD  denosumab (PROLIA) 60 MG/ML SOLN injection Inject 60 mg into the skin every 6 (six) months. Administer in upper arm, thigh, or abdomen    Historical Provider, MD  desoximetasone (TOPICORT) 0.25 % cream Apply 1 application topically 2 (two) times daily. Apply rectally twice daily. 05/04/14   Amy S Esterwood, PA-C  diazepam (VALIUM) 10 MG tablet Take 1 tablet (10 mg total) by mouth once. 09/30/14   Irish Lack, MD  diltiazem (CARDIZEM CD) 180 MG 24 hr capsule Take 180 mg by mouth daily.      Historical Provider, MD  DULoxetine (CYMBALTA) 60 MG capsule Take 60 mg by mouth at bedtime.    Historical Provider, MD  losartan (COZAAR) 100 MG tablet Take 100 mg by mouth at bedtime.    Historical Provider, MD  metoprolol (LOPRESSOR) 50 MG tablet Take 25 mg by mouth 2 (two) times daily.     Historical Provider, MD  MICRONIZED COLESTIPOL HCL 1 G tablet TAKE 1 TABLET BY MOUTH TWICE DAILY(DISCONTINUE [employer]) Patient not taking: Reported on 04/22/2014 04/21/14   Hart Carwin, MD  Multiple Vitamin (MULTIVITAMIN) tablet Take 1 tablet by mouth as needed.     Historical Provider, MD     Family History  Problem Relation Age of Onset  . Heart disease Father   . Heart disease Sister   . 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 Colon cancer Neg Hx   . Heart disease Mother     History   Social History  . Marital Status: Married    Spouse Name: N/A  . Number of Children: 2  . Years of Education: N/A   Occupational History  . retired    Social History Main Topics  . Smoking status: Never Smoker   . Smokeless tobacco: Never Used  . Alcohol Use: 0.6 oz/week    1 Glasses of wine per week     Comment: occasional use  . Drug Use: No  . Sexual Activity: Yes    Birth Control/ Protection: Post-menopausal   Other Topics Concern  . Not on file   Social History Narrative    Review of Systems: A 12 point ROS  discussed and pertinent positives are indicated in the HPI above.  All other systems are negative.  Review of Systems  Constitutional: Negative.   Respiratory: Negative.   Cardiovascular: Negative.   Gastrointestinal: Negative.   Genitourinary: Negative.   Musculoskeletal: Negative.   Neurological: Negative.      Vital Signs: There were no vitals taken for this visit.  Physical Exam  Constitutional: No distress.  Musculoskeletal: She exhibits no edema.  Left lower extremity demonstrates palpable thrombosed varicosities in the superficial medial knee region and left calf. No evidence of overlying erythema, ulceration or ecchymosis. No hematoma is palpated. The left leg shows no significant edema and is symmetric in size with the right leg.  Skin: She is not diaphoretic.     Imaging:  US Venous Img Lower Unilateral Left  10/27/2014   CLINICAL DATA:  Four weeks status post endo venous laser occlusion of a segment of the left great saphenous vein as well as foam sclerotherapy of left calf and knee varicosities on 09/30/2014.  EXAM: LEFT LOWER EXTREMITY VENOUS DUPLEX ULTRASOUND  TECHNIQUE: Gray-scale sonography with graded compression, as well as color Doppler and duplex ultrasound, were performed to evaluate the deep and superficial veins of the lower extremity. Spectral Doppler was utilized to evaluate flow at rest and with distal augmentation maneuvers. I personally performed the technical portion of the exam.  COMPARISON:  10/08/2014  FINDINGS: Deep venous system: Normal patency and compressibility of all of the deep veins without evidence of DVT. No deep venous reflux identified.  Superficial venous system: Treated segment of the left great saphenous vein demonstrates thrombosis and noncompressibility. The saphenofemoral junction shows normal patency. There are multiple thrombosed varicosities across the medial knee region and extending into the calf. No open varicosities are identified. No  abnormal fluid collections are seen.  IMPRESSION: Persistent complete thrombosis of the treated segment of the left great saphenous vein as well as multiple communicating varicosities. No open varicosities are identified. The deep venous system remains normally patent after treatment.   Electronically Signed   By: Irish Lack M.D.   On: 10/27/2014 14:38   US Venous Img Lower Unilateral Left  10/08/2014   CLINICAL DATA:  One week follow-up after ultrasound-guided foam sclerotherapy and endovenous laser occlusion in the left lower extremity.  EXAM: LEFT LOWER EXTREMITY VENOUS DUPLEX ULTRASOUND  TECHNIQUE: Gray-scale sonography with graded compression, as well as color Doppler and duplex ultrasound, were performed to evaluate the deep and superficial veins in the left lower extremity. Spectral Doppler was utilized to evaluate flow at rest and with distal augmentation maneuvers. A superficial venous exam was performed. I personally performed the technical portion of the exam.  COMPARISON:  None.  FINDINGS: Deep venous system: Normal compressibility, augmentation and color Doppler flow in the left common femoral vein, left femoral vein and left popliteal vein. Left posterior tibial veins are patent in the distal calf.  Superficial venous system: The left saphenofemoral junction is patent. The left great saphenous vein is occluded from the proximal thigh to the distal thigh. Varicosities in the medial distal thigh and knee region are non compressible and thrombosed. Varicosities in the proximal calf are thrombosed.  IMPRESSION: Successful treatment of the left great saphenous vein and varicose veins in the left lower extremity. The treated vessels are thrombosed.  Negative for deep vein thrombosis in the left lower extremity.   Electronically Signed   By: Richarda Overlie M.D.   On: 10/08/2014 12:16   [place] Injec Sclerotherapy Single  09/30/2014   CLINICAL DATA:  Recurrent symptomatic varicosities of the left lower  extremity secondary to recanalization of the left great saphenous vein after prior endo venous laser occlusion.  EXAM: 1. ULTRASOUND-GUIDED FOAM SCLEROTHERAPY OF LEFT CALF AND KNEE VARICOSITIES 2. ENDOVASCULAR LASER OCCLUSION OF THE LEFT GREAT SAPHENOUS VEIN.  FLUOROSCOPY TIME:  None  MEDICATIONS AND MEDICAL HISTORY: 10 MG ORAL VALIUM ADMINISTERED PRIOR TO THE PROCEDURE.  ANESTHESIA/SEDATION: LOCAL ANESTHESIA WITH 1% LIDOCAINE  PROCEDURE: The procedure was explained to the patient. The risks and benefits of the procedure were discussed and that patient's questions were addressed. Informed consent was obtained from the patient.  The patient was placed in a supine position. The left lower extremity superficial venous system was evaluated with ultrasound. The left great saphenous vein was identified and the vein path was marked on the skin. The left leg and left groin were prepped with Betadine and a sterile drape was placed.  A left calf varicosity was accessed with a 21 gauge needle under direct ultrasound guidance. A guidewire was advanced. A micropuncture dilator was advanced over the wire. The dilator was flushed and a stopcock applied to the hub of the dilator.  Under ultrasound guidance, a 21 gauge needle was advanced into the left great saphenous vein in the distal thigh. After securing access with a guidewire, a micropuncture dilator was advanced. A 0.035 inch guide wire was advanced to the saphenofemoral junction with ultrasound guidance. A 65 cm, 4 French sheath was placed over the wire and advanced beyond the saphenofemoral junction. A laser fiber was advanced through the sheath and advanced just beyond the tip of the sheath. The sheath was then retracted and distance between the laser fiber tip and saphenofemoral junction was measured and documented by ultrasound.  A mixture of 1.5 mL 1% STS was then foamed with an equal volume of air. This foamed sclerosant was then administered through the micropuncture  dilator positioned in the calf varicosities. Ultrasound was used to confirm dispersal of sclerosant. An additional 0.5 mL of 1% STS was then foamed with air injected via a 25 gauge needle under ultrasound guidance into a varicosity overlying the medial knee region.  Dilute lidocaine was used as a tumescent and administered under ultrasound guidance with a 22 gauge spinal needle automated infusion pump. The great saphenous vein from the percutaneous entry site to the saphenofemoral junction was treated with 300 ml of tumescent. The laser treatment was performed with 1,079 joules over 108 seconds over a 25 cm segment. The sheath and laser were removed as a single entity. Manual compression was placed over the percutaneous entry site. Dressings were placed over the puncture sites and the  leg was placed in a compression stocking.  COMPLICATIONS: None  FINDINGS: In the calf, a well-defined segment of the great saphenous vein was not able to be delineated by ultrasound. Instead, a complex network of varicosities is present which ultimately communicate with the great saphenous vein in the distal thigh and also appear to communicate with a segment of the short saphenous vein. The great saphenous vein was only able to be successfully accessed in the distal thigh. This allowed laser treatment of a 25 cm segment extending from the distal thigh up to just below the saphenofemoral junction. The endo venous laser tip was positioned 2.2 cm from the saphenofemoral junction prior to laser occlusion.  Foam sclerotherapy was performed of varicosities at the level of the knee and calf. Ultrasound shows excellent dispersal of sclerosing throughout the network of varicosities.  IMPRESSION: 1. Ultrasound-guided foam sclerotherapy performed of a complex network of varicosities in the calf and overlying the medial knee. 2. Ultrasound-guided endo venous laser occlusion of the great saphenous vein was performed over a 25 cm segment extending  from the distal thigh up to 2.2 cm below the saphenofemoral junction.   Electronically Signed   By: Irish Lack M.D.   On: 09/30/2014 16:51   Ir Embo Venous Not Hemorr [employer] Guide Roadmapping  09/30/2014   CLINICAL DATA:  Recurrent symptomatic varicosities of the left lower extremity secondary to recanalization of the left great saphenous vein after prior endo venous laser occlusion.  EXAM: 1. ULTRASOUND-GUIDED FOAM SCLEROTHERAPY OF LEFT CALF AND KNEE VARICOSITIES 2. ENDOVASCULAR LASER OCCLUSION OF THE LEFT GREAT SAPHENOUS VEIN.  FLUOROSCOPY TIME:  None  MEDICATIONS AND MEDICAL HISTORY: 10 MG ORAL VALIUM ADMINISTERED PRIOR TO THE PROCEDURE.  ANESTHESIA/SEDATION: LOCAL ANESTHESIA WITH 1% LIDOCAINE  PROCEDURE: The procedure was explained to the patient. The risks and benefits of the procedure were discussed and that patient's questions were addressed. Informed consent was obtained from the patient.  The patient was placed in a supine position. The left lower extremity superficial venous system was evaluated with ultrasound. The left great saphenous vein was identified and the vein path was marked on the skin. The left leg and left groin were prepped with Betadine and a sterile drape was placed.  A left calf varicosity was accessed with a 21 gauge needle under direct ultrasound guidance. A guidewire was advanced. A micropuncture dilator was advanced over the wire. The dilator was flushed and a stopcock applied to the hub of the dilator.  Under ultrasound guidance, a 21 gauge needle was advanced into the left great saphenous vein in the distal thigh. After securing access with a guidewire, a micropuncture dilator was advanced. A 0.035 inch guide wire was advanced to the saphenofemoral junction with ultrasound guidance. A 65 cm, 4 French sheath was placed over the wire and advanced beyond the saphenofemoral junction. A laser fiber was advanced through the sheath and advanced just beyond the tip of the  sheath. The sheath was then retracted and distance between the laser fiber tip and saphenofemoral junction was measured and documented by ultrasound.  A mixture of 1.5 mL 1% STS was then foamed with an equal volume of air. This foamed sclerosant was then administered through the micropuncture dilator positioned in the calf varicosities. Ultrasound was used to confirm dispersal of sclerosant. An additional 0.5 mL of 1% STS was then foamed with air injected via a 25 gauge needle under ultrasound guidance into a varicosity overlying the medial knee region.  Dilute lidocaine  was used as a tumescent and administered under ultrasound guidance with a 22 gauge spinal needle automated infusion pump. The great saphenous vein from the percutaneous entry site to the saphenofemoral junction was treated with 300 ml of tumescent. The laser treatment was performed with 1,079 joules over 108 seconds over a 25 cm segment. The sheath and laser were removed as a single entity. Manual compression was placed over the percutaneous entry site. Dressings were placed over the puncture sites and the leg was placed in a compression stocking.  COMPLICATIONS: None  FINDINGS: In the calf, a well-defined segment of the great saphenous vein was not able to be delineated by ultrasound. Instead, a complex network of varicosities is present which ultimately communicate with the great saphenous vein in the distal thigh and also appear to communicate with a segment of the short saphenous vein. The great saphenous vein was only able to be successfully accessed in the distal thigh. This allowed laser treatment of a 25 cm segment extending from the distal thigh up to just below the saphenofemoral junction. The endo venous laser tip was positioned 2.2 cm from the saphenofemoral junction prior to laser occlusion.  Foam sclerotherapy was performed of varicosities at the level of the knee and calf. Ultrasound shows excellent dispersal of sclerosing throughout  the network of varicosities.  IMPRESSION: 1. Ultrasound-guided foam sclerotherapy performed of a complex network of varicosities in the calf and overlying the medial knee. 2. Ultrasound-guided endo venous laser occlusion of the great saphenous vein was performed over a 25 cm segment extending from the distal thigh up to 2.2 cm below the saphenofemoral junction.   Electronically Signed   By: Irish Lack M.D.   On: 09/30/2014 16:51    Labs:  CBC:  Recent Labs  05/04/14 1601 05/05/14 1107 05/06/14 1458 05/08/14 1247  WBC 6.9 7.3 6.2 6.0  HGB 11.2* 10.7* 10.3* 10.3*  HCT 33.2* 31.3* 29.9* 30.9*  PLT 283.0 266.0 251.0 277.0    COAGS: No results for input(s): INR, APTT in the last 8760 hours.  BMP:  Recent Labs  05/04/14 1601  NA 133*  K 4.3  CL 99  CO2 30  GLUCOSE 94  BUN 14  CALCIUM 8.8  CREATININE 0.62    LIVER FUNCTION TESTS: No results for input(s): BILITOT, AST, ALT, ALKPHOS, PROT, ALBUMIN in the last 8760 hours.  TUMOR MARKERS: No results for input(s): AFPTM, CEA, CA199, CHROMGRNA in the last 8760 hours.  Assessment and Plan:  Ultrasound today demonstrates complete occlusion of the treated segment of the left great saphenous vein as well as complete thrombosis of all communicating varicosities of the left lower extremity. There is some persistent discomfort and tenderness overlying some of the thrombosed varicosities of the medial calf. This is most likely related to inflammation from thrombophlebitis. There is no evidence clinically of cellulitis. Overall results of the most recent treatment is excellent with decrease in clinical symptoms of left lower extremity discomfort. I encouraged regular walking. Follow-up will only be necessary should there be any new complaints.  SignedIrish Lack T 10/27/2014, 2:39 PM     I spent a total of 15 Minutes in face to face in clinical consultation, greater than 50% of which was counseling/coordinating care post  treatment of left lower extremity venous insufficiency.

## 2015-02-18 ENCOUNTER — Encounter: Payer: Self-pay | Admitting: Cardiology

## 2015-03-16 ENCOUNTER — Telehealth: Payer: Self-pay

## 2015-03-16 NOTE — Telephone Encounter (Signed)
Pt is requesting refills of Donnatal. Pt needs office visit. Left vm

## 2015-03-16 NOTE — Telephone Encounter (Signed)
Pt is scheduled with you 05/25/2014. She is requesting refills of Donnatal. Is this ok to refill?

## 2015-03-16 NOTE — Telephone Encounter (Signed)
Okay to refill until appointment? 

## 2015-04-08 ENCOUNTER — Telehealth: Payer: Self-pay | Admitting: Gastroenterology

## 2015-04-08 MED ORDER — PB-HYOSCY-ATROPINE-SCOPOLAMINE 16.2 MG PO TABS: 1.0000 | ORAL_TABLET | ORAL | Status: DC | PRN

## 2015-04-08 NOTE — Telephone Encounter (Signed)
Sent!

## 2015-05-26 ENCOUNTER — Ambulatory Visit: Payer: 59 | Admitting: Gastroenterology

## 2015-06-22 ENCOUNTER — Other Ambulatory Visit (HOSPITAL_COMMUNITY): Payer: Self-pay | Admitting: Interventional Radiology

## 2015-06-22 DIAGNOSIS — I8392 Asymptomatic varicose veins of left lower extremity: Secondary | ICD-10-CM

## 2016-01-19 IMAGING — US US EXTREM LOW VENOUS*L*
1 series · 13 of 24 positions shown · non-contrast
Comparison: 10/08/2014

CLINICAL DATA: Four weeks status post endo venous laser occlusion
of a segment of the left great saphenous vein as well as foam
sclerotherapy of left calf and knee varicosities on 09/30/2014.

EXAM:
LEFT LOWER EXTREMITY VENOUS DUPLEX ULTRASOUND
TECHNIQUE: Gray-scale sonography with graded compression, as well as color
Doppler and duplex ultrasound, were performed to evaluate the deep
and superficial veins of the lower extremity. Spectral Doppler was
utilized to evaluate flow at rest and with distal augmentation
maneuvers. I personally performed the technical portion of the exam.

[Series 1: us extrem low venous*left* · 13 of 26 slices shown]
[im 1/26]
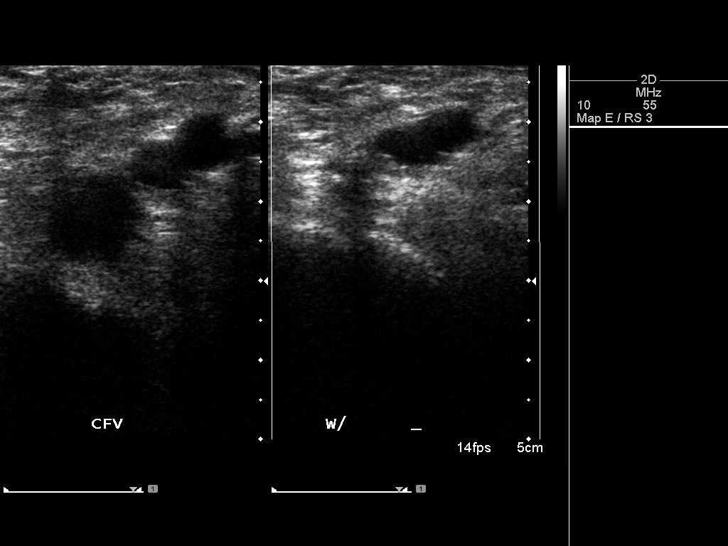
[im 3/26]
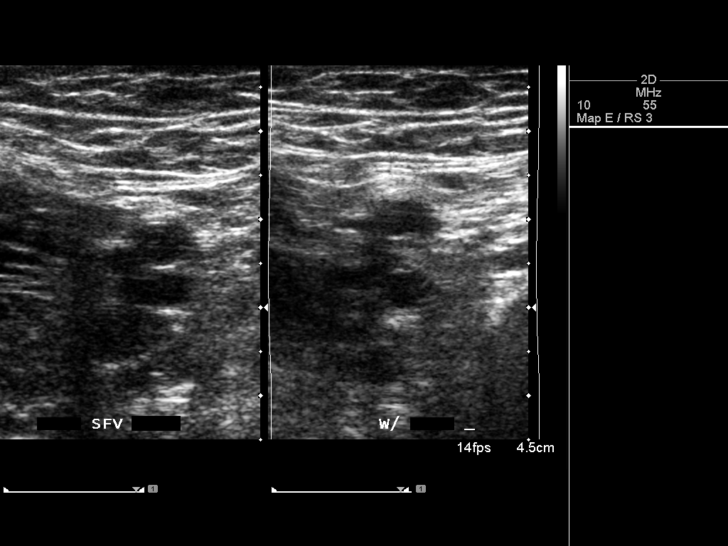
[im 5/26]
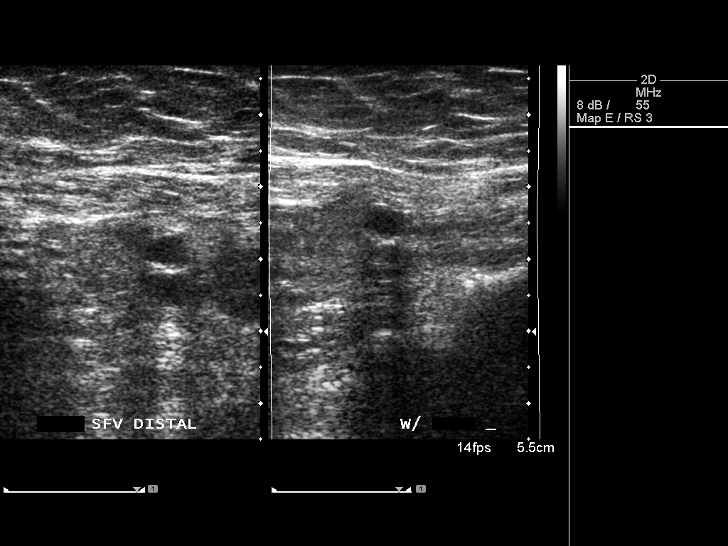
[im 7/26]
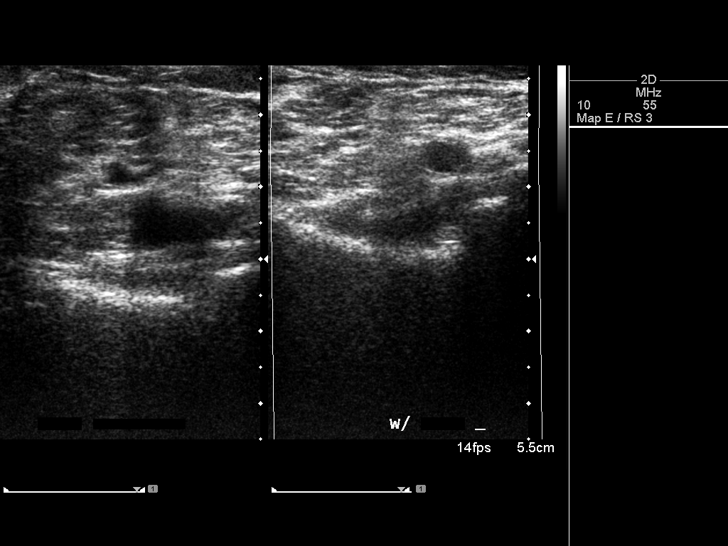
[im 9/26]
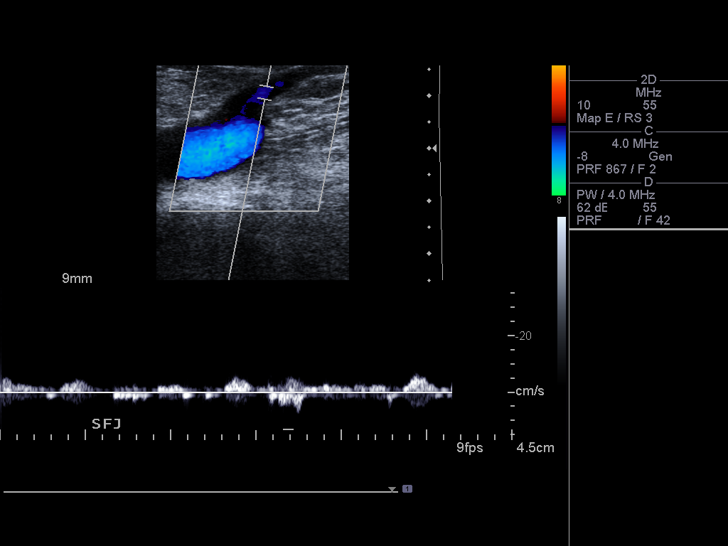
[im 11/26]
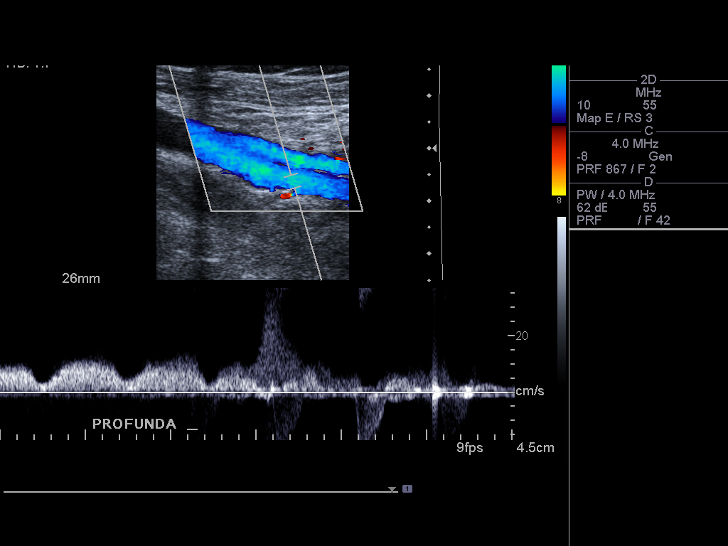
[im 14/26]
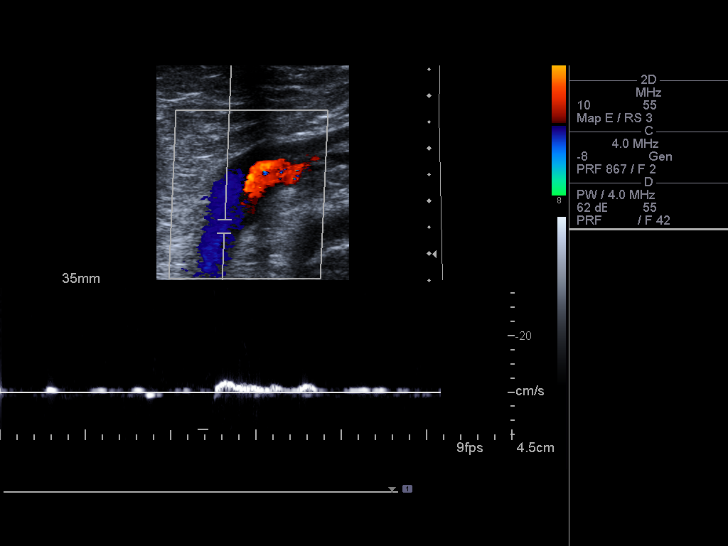
[im 15/26]
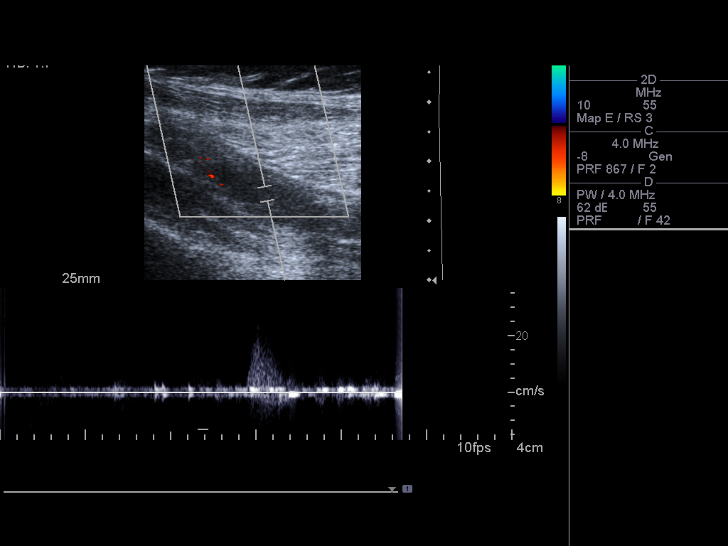
[im 17/26]
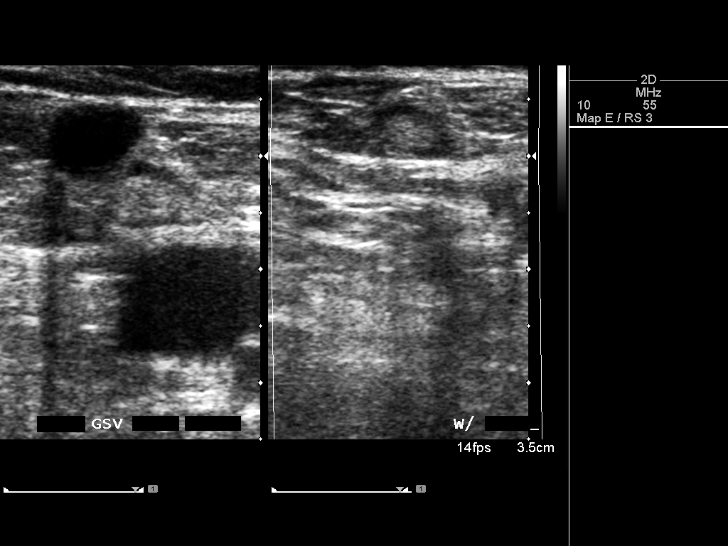
[im 19/26]
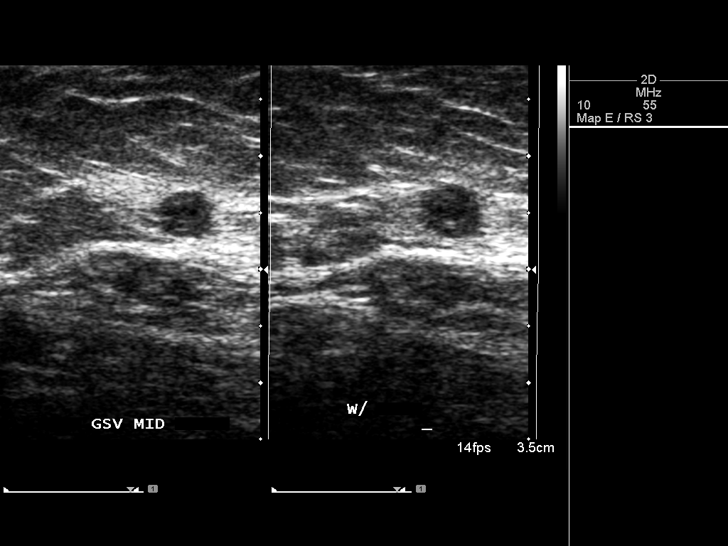
[im 21/26]
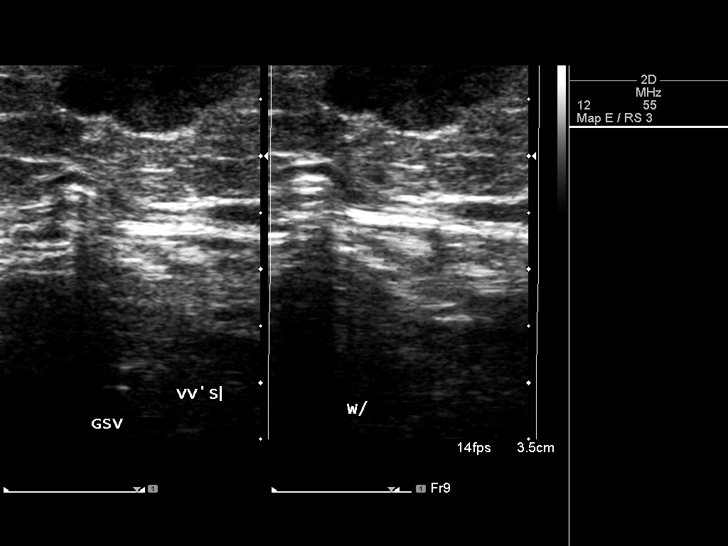
[im 23/26]
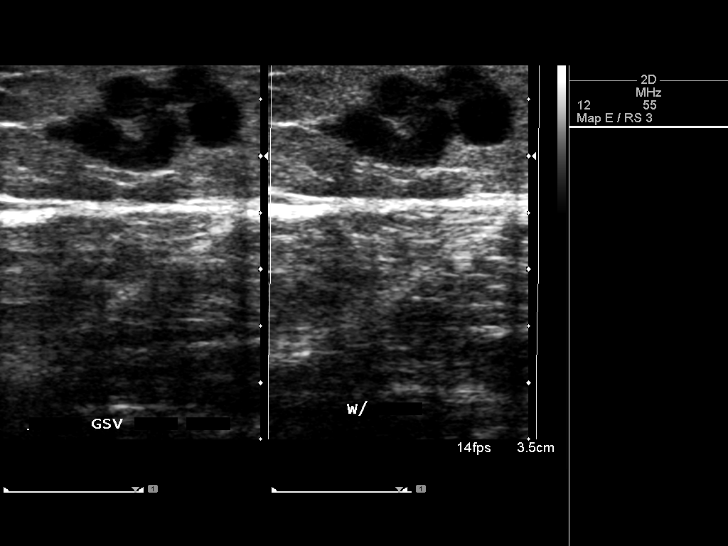
[im 26/26]
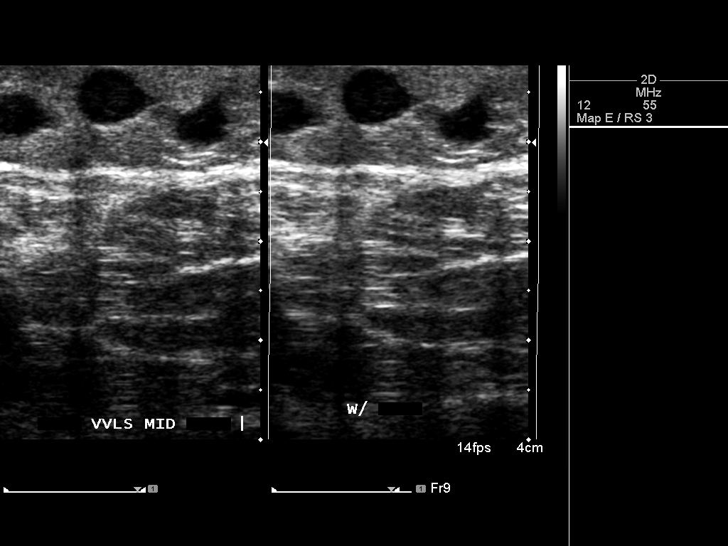

[13 of 24 positions shown; findings below may reference images not displayed]

FINDINGS: Deep venous system: Normal patency and compressibility of all of the
deep veins without evidence of DVT. No deep venous reflux
identified.

Superficial venous system: Treated segment of the left great
saphenous vein demonstrates thrombosis and noncompressibility. The
saphenofemoral junction shows normal patency. There are multiple
thrombosed varicosities across the medial knee region and extending
into the calf. No open varicosities are identified. No abnormal
fluid collections are seen.
IMPRESSION: Persistent complete thrombosis of the treated segment of the left
great saphenous vein as well as multiple communicating varicosities.
No open varicosities are identified. The deep venous system remains
normally patent after treatment.

## 2016-10-16 ENCOUNTER — Other Ambulatory Visit: Payer: Self-pay | Admitting: Orthopedic Surgery

## 2016-10-16 DIAGNOSIS — M5441 Lumbago with sciatica, right side: Secondary | ICD-10-CM

## 2016-10-24 ENCOUNTER — Other Ambulatory Visit: Payer: 59

## 2016-10-25 ENCOUNTER — Other Ambulatory Visit: Payer: Self-pay | Admitting: Orthopedic Surgery

## 2016-10-25 ENCOUNTER — Ambulatory Visit
Admission: RE | Admit: 2016-10-25 | Discharge: 2016-10-25 | Disposition: A | Discharge status: Home / Self Care | Payer: 59 | Source: Ambulatory Visit | Attending: Orthopedic Surgery | Admitting: Orthopedic Surgery

## 2016-10-25 DIAGNOSIS — M5441 Lumbago with sciatica, right side: Secondary | ICD-10-CM

## 2016-10-25 MED ORDER — METHYLPREDNISOLONE ACETATE 40 MG/ML INJ SUSP (RADIOLOG
120.0000 mg | Freq: Once | INTRAMUSCULAR | Status: AC
  Administered 2016-10-25: 120 mg via EPIDURAL

## 2016-10-25 MED ORDER — IOPAMIDOL (ISOVUE-M 200) INJECTION 41%
1.0000 mL | Freq: Once | INTRAMUSCULAR | Status: AC
  Administered 2016-10-25: 1 mL via EPIDURAL

## 2016-10-25 NOTE — Discharge Instructions (Signed)

## 2016-12-14 DIAGNOSIS — I10 Essential (primary) hypertension: Secondary | ICD-10-CM | POA: Diagnosis not present

## 2016-12-14 DIAGNOSIS — E871 Hypo-osmolality and hyponatremia: Secondary | ICD-10-CM

## 2016-12-14 DIAGNOSIS — J181 Lobar pneumonia, unspecified organism: Secondary | ICD-10-CM | POA: Diagnosis not present

## 2016-12-14 DIAGNOSIS — R911 Solitary pulmonary nodule: Secondary | ICD-10-CM | POA: Diagnosis not present

## 2016-12-15 DIAGNOSIS — I10 Essential (primary) hypertension: Secondary | ICD-10-CM | POA: Diagnosis not present

## 2016-12-15 DIAGNOSIS — R911 Solitary pulmonary nodule: Secondary | ICD-10-CM | POA: Diagnosis not present

## 2016-12-15 DIAGNOSIS — E871 Hypo-osmolality and hyponatremia: Secondary | ICD-10-CM | POA: Diagnosis not present

## 2016-12-15 DIAGNOSIS — J181 Lobar pneumonia, unspecified organism: Secondary | ICD-10-CM | POA: Diagnosis not present

## 2016-12-16 DIAGNOSIS — E871 Hypo-osmolality and hyponatremia: Secondary | ICD-10-CM | POA: Diagnosis not present

## 2016-12-16 DIAGNOSIS — J181 Lobar pneumonia, unspecified organism: Secondary | ICD-10-CM | POA: Diagnosis not present

## 2016-12-16 DIAGNOSIS — R911 Solitary pulmonary nodule: Secondary | ICD-10-CM | POA: Diagnosis not present

## 2016-12-16 DIAGNOSIS — I10 Essential (primary) hypertension: Secondary | ICD-10-CM | POA: Diagnosis not present

## 2016-12-17 DIAGNOSIS — I10 Essential (primary) hypertension: Secondary | ICD-10-CM | POA: Diagnosis not present

## 2016-12-17 DIAGNOSIS — R911 Solitary pulmonary nodule: Secondary | ICD-10-CM | POA: Diagnosis not present

## 2016-12-17 DIAGNOSIS — E871 Hypo-osmolality and hyponatremia: Secondary | ICD-10-CM | POA: Diagnosis not present

## 2016-12-17 DIAGNOSIS — J181 Lobar pneumonia, unspecified organism: Secondary | ICD-10-CM | POA: Diagnosis not present

## 2016-12-18 DIAGNOSIS — J181 Lobar pneumonia, unspecified organism: Secondary | ICD-10-CM | POA: Diagnosis not present

## 2016-12-18 DIAGNOSIS — E871 Hypo-osmolality and hyponatremia: Secondary | ICD-10-CM | POA: Diagnosis not present

## 2016-12-18 DIAGNOSIS — R911 Solitary pulmonary nodule: Secondary | ICD-10-CM | POA: Diagnosis not present

## 2016-12-18 DIAGNOSIS — I10 Essential (primary) hypertension: Secondary | ICD-10-CM | POA: Diagnosis not present

## 2016-12-19 DIAGNOSIS — I10 Essential (primary) hypertension: Secondary | ICD-10-CM | POA: Diagnosis not present

## 2016-12-19 DIAGNOSIS — E871 Hypo-osmolality and hyponatremia: Secondary | ICD-10-CM | POA: Diagnosis not present

## 2016-12-19 DIAGNOSIS — J181 Lobar pneumonia, unspecified organism: Secondary | ICD-10-CM | POA: Diagnosis not present

## 2016-12-19 DIAGNOSIS — R911 Solitary pulmonary nodule: Secondary | ICD-10-CM | POA: Diagnosis not present

## 2016-12-20 DIAGNOSIS — R911 Solitary pulmonary nodule: Secondary | ICD-10-CM | POA: Diagnosis not present

## 2016-12-20 DIAGNOSIS — E871 Hypo-osmolality and hyponatremia: Secondary | ICD-10-CM | POA: Diagnosis not present

## 2016-12-20 DIAGNOSIS — J181 Lobar pneumonia, unspecified organism: Secondary | ICD-10-CM | POA: Diagnosis not present

## 2016-12-20 DIAGNOSIS — I10 Essential (primary) hypertension: Secondary | ICD-10-CM | POA: Diagnosis not present

## 2016-12-21 DIAGNOSIS — I10 Essential (primary) hypertension: Secondary | ICD-10-CM | POA: Diagnosis not present

## 2016-12-21 DIAGNOSIS — R911 Solitary pulmonary nodule: Secondary | ICD-10-CM | POA: Diagnosis not present

## 2016-12-21 DIAGNOSIS — E871 Hypo-osmolality and hyponatremia: Secondary | ICD-10-CM | POA: Diagnosis not present

## 2016-12-21 DIAGNOSIS — J181 Lobar pneumonia, unspecified organism: Secondary | ICD-10-CM | POA: Diagnosis not present

## 2017-03-12 ENCOUNTER — Encounter: Payer: Self-pay | Admitting: Internal Medicine

## 2017-03-12 DIAGNOSIS — I951 Orthostatic hypotension: Secondary | ICD-10-CM | POA: Diagnosis not present

## 2017-03-12 DIAGNOSIS — K625 Hemorrhage of anus and rectum: Secondary | ICD-10-CM

## 2017-03-13 DIAGNOSIS — I951 Orthostatic hypotension: Secondary | ICD-10-CM | POA: Diagnosis not present

## 2017-03-13 DIAGNOSIS — K625 Hemorrhage of anus and rectum: Secondary | ICD-10-CM | POA: Diagnosis not present

## 2017-03-14 ENCOUNTER — Encounter: Payer: Self-pay | Admitting: Gastroenterology

## 2017-03-14 DIAGNOSIS — I951 Orthostatic hypotension: Secondary | ICD-10-CM | POA: Diagnosis not present

## 2017-03-14 DIAGNOSIS — K625 Hemorrhage of anus and rectum: Secondary | ICD-10-CM | POA: Diagnosis not present

## 2017-03-15 DIAGNOSIS — I951 Orthostatic hypotension: Secondary | ICD-10-CM | POA: Diagnosis not present

## 2017-03-15 DIAGNOSIS — R109 Unspecified abdominal pain: Secondary | ICD-10-CM | POA: Diagnosis not present

## 2017-03-15 DIAGNOSIS — K625 Hemorrhage of anus and rectum: Secondary | ICD-10-CM | POA: Diagnosis not present

## 2017-03-16 DIAGNOSIS — I951 Orthostatic hypotension: Secondary | ICD-10-CM | POA: Diagnosis not present

## 2017-03-16 DIAGNOSIS — K625 Hemorrhage of anus and rectum: Secondary | ICD-10-CM | POA: Diagnosis not present

## 2017-03-17 DIAGNOSIS — K5731 Diverticulosis of large intestine without perforation or abscess with bleeding: Secondary | ICD-10-CM | POA: Diagnosis not present

## 2017-03-17 DIAGNOSIS — E871 Hypo-osmolality and hyponatremia: Secondary | ICD-10-CM

## 2017-03-17 DIAGNOSIS — D62 Acute posthemorrhagic anemia: Secondary | ICD-10-CM

## 2017-03-18 DIAGNOSIS — D62 Acute posthemorrhagic anemia: Secondary | ICD-10-CM | POA: Diagnosis not present

## 2017-03-18 DIAGNOSIS — E871 Hypo-osmolality and hyponatremia: Secondary | ICD-10-CM | POA: Diagnosis not present

## 2017-03-18 DIAGNOSIS — K5731 Diverticulosis of large intestine without perforation or abscess with bleeding: Secondary | ICD-10-CM | POA: Diagnosis not present

## 2018-05-23 ENCOUNTER — Encounter: Payer: Self-pay | Admitting: Gastroenterology

## 2019-04-23 DIAGNOSIS — M159 Polyosteoarthritis, unspecified: Secondary | ICD-10-CM | POA: Diagnosis not present

## 2019-04-23 DIAGNOSIS — I10 Essential (primary) hypertension: Secondary | ICD-10-CM | POA: Diagnosis not present

## 2019-04-23 DIAGNOSIS — R413 Other amnesia: Secondary | ICD-10-CM | POA: Diagnosis not present

## 2019-04-23 DIAGNOSIS — F411 Generalized anxiety disorder: Secondary | ICD-10-CM | POA: Diagnosis not present

## 2019-04-23 DIAGNOSIS — Z9181 History of falling: Secondary | ICD-10-CM | POA: Diagnosis not present

## 2019-04-23 DIAGNOSIS — Z8673 Personal history of transient ischemic attack (TIA), and cerebral infarction without residual deficits: Secondary | ICD-10-CM | POA: Diagnosis not present

## 2019-07-30 DIAGNOSIS — R413 Other amnesia: Secondary | ICD-10-CM | POA: Diagnosis not present

## 2019-07-30 DIAGNOSIS — M159 Polyosteoarthritis, unspecified: Secondary | ICD-10-CM | POA: Diagnosis not present

## 2019-07-30 DIAGNOSIS — F411 Generalized anxiety disorder: Secondary | ICD-10-CM | POA: Diagnosis not present

## 2019-07-30 DIAGNOSIS — E785 Hyperlipidemia, unspecified: Secondary | ICD-10-CM | POA: Diagnosis not present

## 2019-07-30 DIAGNOSIS — J309 Allergic rhinitis, unspecified: Secondary | ICD-10-CM | POA: Diagnosis not present

## 2019-07-30 DIAGNOSIS — I471 Supraventricular tachycardia: Secondary | ICD-10-CM | POA: Diagnosis not present

## 2019-07-30 DIAGNOSIS — I10 Essential (primary) hypertension: Secondary | ICD-10-CM | POA: Diagnosis not present

## 2019-07-30 DIAGNOSIS — Z79899 Other long term (current) drug therapy: Secondary | ICD-10-CM | POA: Diagnosis not present

## 2019-07-30 DIAGNOSIS — Z8673 Personal history of transient ischemic attack (TIA), and cerebral infarction without residual deficits: Secondary | ICD-10-CM | POA: Diagnosis not present

## 2019-08-01 DIAGNOSIS — Z79899 Other long term (current) drug therapy: Secondary | ICD-10-CM | POA: Diagnosis not present

## 2019-08-01 DIAGNOSIS — E785 Hyperlipidemia, unspecified: Secondary | ICD-10-CM | POA: Diagnosis not present

## 2019-08-01 DIAGNOSIS — E559 Vitamin D deficiency, unspecified: Secondary | ICD-10-CM | POA: Diagnosis not present

## 2019-08-08 DIAGNOSIS — K922 Gastrointestinal hemorrhage, unspecified: Secondary | ICD-10-CM | POA: Diagnosis not present

## 2019-08-15 DIAGNOSIS — K625 Hemorrhage of anus and rectum: Secondary | ICD-10-CM | POA: Diagnosis not present

## 2019-08-15 DIAGNOSIS — Z8673 Personal history of transient ischemic attack (TIA), and cerebral infarction without residual deficits: Secondary | ICD-10-CM | POA: Diagnosis not present

## 2019-08-15 DIAGNOSIS — Z09 Encounter for follow-up examination after completed treatment for conditions other than malignant neoplasm: Secondary | ICD-10-CM | POA: Diagnosis not present

## 2019-08-25 ENCOUNTER — Encounter: Payer: Self-pay | Admitting: Nurse Practitioner

## 2019-09-01 NOTE — Progress Notes (Signed)
09/01/2019 DESYRE CALMA [id number] [birth year]   CHIEF COMPLAINT: Rectal bleeding   HISTORY OF PRESENT ILLNESS:  Jelitza Manninen is an 86 year old female with a past medical history of asthma, hypertension,  TIA on Plavix and dementia.  She presents today for further evaluation regarding rectal bleeding.  She reported passing a small amount of bright red blood which was noted on the toilet tissue on 08/08/2019.  Her daughter, Elisha Headland, who is present stated she saw a moderate amount of bright red blood in the toilet which turned the toilet water red.  The patient does not recall seeing that much blood.  She denies straining to pass a bowel movement.  The patient has dementia and she has great difficulty recalling her symptoms.  She presented to [facility] emergency room on 08/08/2019 for further evaluation.  Her hemoglobin level was apparently stable and she did not require a blood transfusion.  She was discharged with instructions to follow-up with Dr. Lyndel Safe.  I have requested a copy of her ER evaluation for further review. Her daughter stated her mother had similar rectal bleeding in 2017 which required hospitalization, transfusion of 1 unit of packed red blood cells and she underwent a colonoscopy by Dr. Lyndel Safe which showed a possible diverticular bleed. She is taking Ferrous Sulfate 325 mg 3 days weekly, it is unclear when she started taking this.  Initially, she took the iron daily which resulted in constipation therefore she reduced the dose to 3 times weekly.  She remains on Plavix secondary to having a TIA.  No history of coronary artery disease.  Currently, she denies having any upper or lower abdominal pain.  No heartburn or dysphagia.  She is passing normal brown bowel movement every other day.  No further rectal bleeding.  No melena. She does not wish to have any invasive endoscopic procedure unless absolutely necessary.  Sigmoidoscopy by Dr. Olevia Perches 04/22/2014 due to having  diarrhea.  Diverticulosis to the sigmoid colon.  Biopsies were benign, no evidence of  microscopic colitis.   Colonoscopy 05/12/2008: Sigmoid diverticulitis   Colonoscopy 02/06/1989: Sigmoid diverticulosis   Past Medical History:  Diagnosis Date  . Allergic rhinitis   . Anemia   . Anxiety   . Asthma   . Cerebrovascular disease, unspecified   . Chronic insomnia   . Diverticulosis of colon (without mention of hemorrhage)   . DJD (degenerative joint disease)   . History of shingles   . Hypertension   . Hyponatremia   . Interstitial cystitis   . Irritable bowel syndrome   . Irritable bowel syndrome with diarrhea   . Osteoporosis   . Other and unspecified hyperlipidemia   . Postherpetic neuralgia   . PSVT (paroxysmal supraventricular tachycardia)   . Stress disorder, acute   . Stroke    Hx of strokes  . Vitamin D deficiency    Past Surgical History:  Procedure Laterality Date  . APPENDECTOMY    . DILATION AND CURETTAGE OF UTERUS    . TONSILLECTOMY       reports that she has never smoked. She has never used smokeless tobacco. She reports current alcohol use of about 1.0 standard drinks of alcohol per week. She reports that she does not use drugs. family history includes Cerebral aneurysm in her daughter; Heart disease in her father, mother, and sister. Allergies  Allergen Reactions  . Sulfonamide Derivatives Shortness Of Breath      Outpatient Encounter Medications as of 09/03/2019  Medication  Sig  . aspirin 81 MG tablet Take 81 mg by mouth daily.    [place] Rocker alk-PHENObarbital (DONNATAL) 16.2 MG tablet Take 1 tablet by mouth as needed.  . Cholecalciferol (VITAMIN D3) 2000 UNITS capsule Take 2,000 Units by mouth daily.  . clopidogrel (PLAVIX) 75 MG tablet Take 75 mg by mouth daily.    Marland Kitchen denosumab (PROLIA) 60 MG/ML SOLN injection Inject 60 mg into the skin every 6 (six) months. Administer in upper arm, thigh, or abdomen  . desoximetasone (TOPICORT) 0.25 % cream Apply  1 application topically 2 (two) times daily. Apply rectally twice daily.  . diazepam (VALIUM) 10 MG tablet Take 1 tablet (10 mg total) by mouth once.  . diltiazem (CARDIZEM CD) 180 MG 24 hr capsule Take 180 mg by mouth daily.    . DULoxetine (CYMBALTA) 60 MG capsule Take 60 mg by mouth at bedtime.  Marland Kitchen losartan (COZAAR) 100 MG tablet Take 100 mg by mouth at bedtime.  . metoprolol (LOPRESSOR) 50 MG tablet Take 25 mg by mouth 2 (two) times daily.   Marland Kitchen MICRONIZED COLESTIPOL HCL 1 G tablet TAKE 1 TABLET BY MOUTH TWICE DAILY(DISCONTINUE XIFAXAN) (Patient not taking: Reported on 04/22/2014)  . Multiple Vitamin (MULTIVITAMIN) tablet Take 1 tablet by mouth as needed.    No facility-administered encounter medications on file as of 09/03/2019.     REVIEW OF SYSTEMS: All other systems reviewed and negative except where noted in the History of Present Illness.   PHYSICAL EXAM: BP 138/60   Pulse 60   Ht 5' 1.75" (1.568 m)   Wt 104 lb 12.8 oz (47.5 kg)   BMI 19.32 kg/m   General: Well developed  86 year old female in no acute distress. Head: Normocephalic and atraumatic. Eyes:  Sclerae non-icteric, conjunctive pink. Ears: Normal auditory acuity. Mouth: No ulcers or lesions.  Neck: Supple, no lymphadenopathy or thyromegaly.  Lungs: Clear bilaterally to auscultation without wheezes, crackles or rhonchi. Heart: Regular rate and rhythm. No murmur, rub or gallop appreciated.  Abdomen: Soft, nontender, non distended. No masses. No hepatosplenomegaly. Normoactive bowel sounds x 4 quadrants.  Rectal: Anal hemorrhoids without evidence of active bleeding.  Soft brown stool in the rectum was heme positive. Musculoskeletal: Symmetrical with no gross deformities. Skin: Warm and dry. No rash or lesions on visible extremities. Extremities: No edema. Neurological: Alert oriented x 4, no focal deficits.  Psychological:  Alert and cooperative. Normal mood and affect.  ASSESSMENT AND PLAN:  67.  86 year old female  with 1 episode of hematochezia, possible diverticular bleed.  Rectal exam today showed anal hemorrhoids without obvious rectal bleeding, stool in the rectal vault was brown and heme positive. -[facility] ED records -CBC, iron, iron saturation, TIBC and ferritin  today -Continue ferrous sulfate 315m po once daily 3 days weekly for now -Desitin apply a small amount inside the anal area and external anal area 2-3 times daily -Patient declines endoscopic evaluation unless absolutely medically necessary -Patient to call our office if rectal bleeding recurs, to the ED if severe rectal bleeding occurs -Miralax Q HS  as needed  2. Dementia   3. TIA on Plavix and     CC:  UNicholos Johns MD

## 2019-09-03 ENCOUNTER — Encounter: Payer: Self-pay | Admitting: Nurse Practitioner

## 2019-09-03 ENCOUNTER — Other Ambulatory Visit (INDEPENDENT_AMBULATORY_CARE_PROVIDER_SITE_OTHER): Payer: Medicare PPO

## 2019-09-03 ENCOUNTER — Ambulatory Visit: Payer: Medicare PPO | Admitting: Nurse Practitioner

## 2019-09-03 VITALS — BP 138/60 | HR 60 | Ht 61.75 in | Wt 104.8 lb

## 2019-09-03 DIAGNOSIS — K640 First degree hemorrhoids: Secondary | ICD-10-CM | POA: Diagnosis not present

## 2019-09-03 DIAGNOSIS — K921 Melena: Secondary | ICD-10-CM

## 2019-09-03 HISTORY — DX: Melena: K92.1

## 2019-09-03 LAB — CBC WITH DIFFERENTIAL/PLATELET
Basophils Absolute: 0.1 10*3/uL (ref 0.0–0.1)
Basophils Relative: 1.2 % (ref 0.0–3.0)
Eosinophils Absolute: 0 10*3/uL (ref 0.0–0.7)
Eosinophils Relative: 0.9 % (ref 0.0–5.0)
HCT: 31.6 % — ABNORMAL LOW (ref 36.0–46.0)
Hemoglobin: 10.6 g/dL — ABNORMAL LOW (ref 12.0–15.0)
Lymphocytes Relative: 20.2 % (ref 12.0–46.0)
Lymphs Abs: 1.1 10*3/uL (ref 0.7–4.0)
MCHC: 33.4 g/dL (ref 30.0–36.0)
MCV: 94.3 fl (ref 78.0–100.0)
Monocytes Absolute: 0.6 10*3/uL (ref 0.1–1.0)
Monocytes Relative: 11.3 % (ref 3.0–12.0)
Neutro Abs: 3.5 10*3/uL (ref 1.4–7.7)
Neutrophils Relative %: 66.4 % (ref 43.0–77.0)
Platelets: 325 10*3/uL (ref 150.0–400.0)
RBC: 3.35 Mil/uL — ABNORMAL LOW (ref 3.87–5.11)
RDW: 13.1 % (ref 11.5–15.5)
WBC: 5.3 10*3/uL (ref 4.0–10.5)

## 2019-09-03 NOTE — Patient Instructions (Addendum)
If you are age 84 or older, your body mass index should be between 23-30. Your Body mass index is 19.32 kg/m. If this is out of the aforementioned range listed, please consider follow up with your Primary Care Provider.  If you are age 36 or younger, your body mass index should be between 19-25. Your Body mass index is 19.32 kg/m. If this is out of the aformentioned range listed, please consider follow up with your Primary Care Provider.   Your provider has requested that you go to the basement level for lab work before leaving today. Press "B" on the elevator. The lab is located at the first door on the left as you exit the elevator.  Due to recent changes in healthcare laws, you may see the results of your imaging and laboratory studies on MyChart before your provider has had a chance to review them.  We understand that in some cases there may be results that are confusing or concerning to you. Not all laboratory results come back in the same time frame and the provider may be waiting for multiple results in order to interpret others.  Please give [place] 48 hours in order for your provider to thoroughly review all the results before contacting the office for clarification of your results.   Apply Desitin in a small amount around the anal opening and external anal area 2-3 times a day for the next 5-7 days as needed.   Go to the ER if same rectal bleeding occurs.  Call the office if rectal bleeding reoccurs.   Follow up pending.

## 2019-09-14 NOTE — Progress Notes (Signed)
Agree with plan RG 

## 2019-11-17 DIAGNOSIS — F411 Generalized anxiety disorder: Secondary | ICD-10-CM | POA: Diagnosis not present

## 2019-11-17 DIAGNOSIS — Z79899 Other long term (current) drug therapy: Secondary | ICD-10-CM | POA: Diagnosis not present

## 2019-11-17 DIAGNOSIS — R413 Other amnesia: Secondary | ICD-10-CM | POA: Diagnosis not present

## 2019-11-17 DIAGNOSIS — M159 Polyosteoarthritis, unspecified: Secondary | ICD-10-CM | POA: Diagnosis not present

## 2019-11-17 DIAGNOSIS — E559 Vitamin D deficiency, unspecified: Secondary | ICD-10-CM | POA: Diagnosis not present

## 2019-11-17 DIAGNOSIS — J309 Allergic rhinitis, unspecified: Secondary | ICD-10-CM | POA: Diagnosis not present

## 2019-11-17 DIAGNOSIS — I10 Essential (primary) hypertension: Secondary | ICD-10-CM | POA: Diagnosis not present

## 2019-11-17 DIAGNOSIS — Z8673 Personal history of transient ischemic attack (TIA), and cerebral infarction without residual deficits: Secondary | ICD-10-CM | POA: Diagnosis not present

## 2019-11-17 DIAGNOSIS — K625 Hemorrhage of anus and rectum: Secondary | ICD-10-CM | POA: Diagnosis not present

## 2019-11-17 DIAGNOSIS — I471 Supraventricular tachycardia: Secondary | ICD-10-CM | POA: Diagnosis not present

## 2019-11-17 DIAGNOSIS — E785 Hyperlipidemia, unspecified: Secondary | ICD-10-CM | POA: Diagnosis not present

## 2020-03-03 DIAGNOSIS — I471 Supraventricular tachycardia: Secondary | ICD-10-CM | POA: Diagnosis not present

## 2020-03-03 DIAGNOSIS — I10 Essential (primary) hypertension: Secondary | ICD-10-CM | POA: Diagnosis not present

## 2020-03-03 DIAGNOSIS — M159 Polyosteoarthritis, unspecified: Secondary | ICD-10-CM | POA: Diagnosis not present

## 2020-03-03 DIAGNOSIS — F411 Generalized anxiety disorder: Secondary | ICD-10-CM | POA: Diagnosis not present

## 2020-03-03 DIAGNOSIS — E559 Vitamin D deficiency, unspecified: Secondary | ICD-10-CM | POA: Diagnosis not present

## 2020-03-03 DIAGNOSIS — R413 Other amnesia: Secondary | ICD-10-CM | POA: Diagnosis not present

## 2020-03-03 DIAGNOSIS — Z8673 Personal history of transient ischemic attack (TIA), and cerebral infarction without residual deficits: Secondary | ICD-10-CM | POA: Diagnosis not present

## 2020-03-03 DIAGNOSIS — E785 Hyperlipidemia, unspecified: Secondary | ICD-10-CM | POA: Diagnosis not present

## 2020-03-03 DIAGNOSIS — J309 Allergic rhinitis, unspecified: Secondary | ICD-10-CM | POA: Diagnosis not present

## 2020-05-10 DIAGNOSIS — R300 Dysuria: Secondary | ICD-10-CM | POA: Diagnosis not present

## 2020-06-16 DIAGNOSIS — R413 Other amnesia: Secondary | ICD-10-CM | POA: Diagnosis not present

## 2020-06-16 DIAGNOSIS — M159 Polyosteoarthritis, unspecified: Secondary | ICD-10-CM | POA: Diagnosis not present

## 2020-06-16 DIAGNOSIS — Z8673 Personal history of transient ischemic attack (TIA), and cerebral infarction without residual deficits: Secondary | ICD-10-CM | POA: Diagnosis not present

## 2020-06-16 DIAGNOSIS — Z79899 Other long term (current) drug therapy: Secondary | ICD-10-CM | POA: Diagnosis not present

## 2020-06-16 DIAGNOSIS — J309 Allergic rhinitis, unspecified: Secondary | ICD-10-CM | POA: Diagnosis not present

## 2020-06-16 DIAGNOSIS — I471 Supraventricular tachycardia: Secondary | ICD-10-CM | POA: Diagnosis not present

## 2020-06-16 DIAGNOSIS — E559 Vitamin D deficiency, unspecified: Secondary | ICD-10-CM | POA: Diagnosis not present

## 2020-06-16 DIAGNOSIS — E785 Hyperlipidemia, unspecified: Secondary | ICD-10-CM | POA: Diagnosis not present

## 2020-06-16 DIAGNOSIS — I10 Essential (primary) hypertension: Secondary | ICD-10-CM | POA: Diagnosis not present

## 2020-08-24 DIAGNOSIS — J209 Acute bronchitis, unspecified: Secondary | ICD-10-CM | POA: Diagnosis not present

## 2020-08-24 DIAGNOSIS — J019 Acute sinusitis, unspecified: Secondary | ICD-10-CM | POA: Diagnosis not present

## 2020-10-20 DIAGNOSIS — J309 Allergic rhinitis, unspecified: Secondary | ICD-10-CM | POA: Diagnosis not present

## 2020-10-20 DIAGNOSIS — R413 Other amnesia: Secondary | ICD-10-CM | POA: Diagnosis not present

## 2020-10-20 DIAGNOSIS — I10 Essential (primary) hypertension: Secondary | ICD-10-CM | POA: Diagnosis not present

## 2020-10-20 DIAGNOSIS — E559 Vitamin D deficiency, unspecified: Secondary | ICD-10-CM | POA: Diagnosis not present

## 2020-10-20 DIAGNOSIS — E785 Hyperlipidemia, unspecified: Secondary | ICD-10-CM | POA: Diagnosis not present

## 2020-10-20 DIAGNOSIS — I471 Supraventricular tachycardia: Secondary | ICD-10-CM | POA: Diagnosis not present

## 2020-10-20 DIAGNOSIS — Z8673 Personal history of transient ischemic attack (TIA), and cerebral infarction without residual deficits: Secondary | ICD-10-CM | POA: Diagnosis not present

## 2020-10-20 DIAGNOSIS — Z79899 Other long term (current) drug therapy: Secondary | ICD-10-CM | POA: Diagnosis not present

## 2020-10-20 DIAGNOSIS — M159 Polyosteoarthritis, unspecified: Secondary | ICD-10-CM | POA: Diagnosis not present

## 2020-10-21 DIAGNOSIS — E559 Vitamin D deficiency, unspecified: Secondary | ICD-10-CM | POA: Diagnosis not present

## 2020-10-21 DIAGNOSIS — Z79899 Other long term (current) drug therapy: Secondary | ICD-10-CM | POA: Diagnosis not present

## 2020-10-21 DIAGNOSIS — E785 Hyperlipidemia, unspecified: Secondary | ICD-10-CM | POA: Diagnosis not present

## 2020-10-23 DIAGNOSIS — I739 Peripheral vascular disease, unspecified: Secondary | ICD-10-CM | POA: Diagnosis not present

## 2020-10-23 DIAGNOSIS — I10 Essential (primary) hypertension: Secondary | ICD-10-CM | POA: Diagnosis not present

## 2020-10-23 DIAGNOSIS — E785 Hyperlipidemia, unspecified: Secondary | ICD-10-CM | POA: Diagnosis not present

## 2020-10-23 DIAGNOSIS — S51812D Laceration without foreign body of left forearm, subsequent encounter: Secondary | ICD-10-CM | POA: Diagnosis not present

## 2020-10-23 DIAGNOSIS — K5790 Diverticulosis of intestine, part unspecified, without perforation or abscess without bleeding: Secondary | ICD-10-CM | POA: Diagnosis not present

## 2020-10-23 DIAGNOSIS — M159 Polyosteoarthritis, unspecified: Secondary | ICD-10-CM | POA: Diagnosis not present

## 2020-10-23 DIAGNOSIS — M810 Age-related osteoporosis without current pathological fracture: Secondary | ICD-10-CM | POA: Diagnosis not present

## 2020-10-23 DIAGNOSIS — J309 Allergic rhinitis, unspecified: Secondary | ICD-10-CM | POA: Diagnosis not present

## 2020-10-23 DIAGNOSIS — F5104 Psychophysiologic insomnia: Secondary | ICD-10-CM | POA: Diagnosis not present

## 2020-10-26 DIAGNOSIS — S51812D Laceration without foreign body of left forearm, subsequent encounter: Secondary | ICD-10-CM | POA: Diagnosis not present

## 2020-10-26 DIAGNOSIS — M810 Age-related osteoporosis without current pathological fracture: Secondary | ICD-10-CM | POA: Diagnosis not present

## 2020-10-26 DIAGNOSIS — K5790 Diverticulosis of intestine, part unspecified, without perforation or abscess without bleeding: Secondary | ICD-10-CM | POA: Diagnosis not present

## 2020-10-26 DIAGNOSIS — J309 Allergic rhinitis, unspecified: Secondary | ICD-10-CM | POA: Diagnosis not present

## 2020-10-26 DIAGNOSIS — I739 Peripheral vascular disease, unspecified: Secondary | ICD-10-CM | POA: Diagnosis not present

## 2020-10-26 DIAGNOSIS — I10 Essential (primary) hypertension: Secondary | ICD-10-CM | POA: Diagnosis not present

## 2020-10-26 DIAGNOSIS — F5104 Psychophysiologic insomnia: Secondary | ICD-10-CM | POA: Diagnosis not present

## 2020-10-26 DIAGNOSIS — M159 Polyosteoarthritis, unspecified: Secondary | ICD-10-CM | POA: Diagnosis not present

## 2020-10-26 DIAGNOSIS — E785 Hyperlipidemia, unspecified: Secondary | ICD-10-CM | POA: Diagnosis not present

## 2020-10-28 DIAGNOSIS — J309 Allergic rhinitis, unspecified: Secondary | ICD-10-CM | POA: Diagnosis not present

## 2020-10-28 DIAGNOSIS — M159 Polyosteoarthritis, unspecified: Secondary | ICD-10-CM | POA: Diagnosis not present

## 2020-10-28 DIAGNOSIS — I739 Peripheral vascular disease, unspecified: Secondary | ICD-10-CM | POA: Diagnosis not present

## 2020-10-28 DIAGNOSIS — E785 Hyperlipidemia, unspecified: Secondary | ICD-10-CM | POA: Diagnosis not present

## 2020-10-28 DIAGNOSIS — I10 Essential (primary) hypertension: Secondary | ICD-10-CM | POA: Diagnosis not present

## 2020-10-28 DIAGNOSIS — F5104 Psychophysiologic insomnia: Secondary | ICD-10-CM | POA: Diagnosis not present

## 2020-10-28 DIAGNOSIS — M810 Age-related osteoporosis without current pathological fracture: Secondary | ICD-10-CM | POA: Diagnosis not present

## 2020-10-28 DIAGNOSIS — S51812D Laceration without foreign body of left forearm, subsequent encounter: Secondary | ICD-10-CM | POA: Diagnosis not present

## 2020-10-28 DIAGNOSIS — K5790 Diverticulosis of intestine, part unspecified, without perforation or abscess without bleeding: Secondary | ICD-10-CM | POA: Diagnosis not present

## 2020-10-29 ENCOUNTER — Encounter: Payer: Self-pay | Admitting: Cardiology

## 2020-10-29 ENCOUNTER — Encounter: Payer: Self-pay | Admitting: *Deleted

## 2020-11-03 DIAGNOSIS — M810 Age-related osteoporosis without current pathological fracture: Secondary | ICD-10-CM | POA: Diagnosis not present

## 2020-11-03 DIAGNOSIS — W19XXXA Unspecified fall, initial encounter: Secondary | ICD-10-CM | POA: Diagnosis not present

## 2020-11-03 DIAGNOSIS — I739 Peripheral vascular disease, unspecified: Secondary | ICD-10-CM | POA: Diagnosis not present

## 2020-11-03 DIAGNOSIS — S51812D Laceration without foreign body of left forearm, subsequent encounter: Secondary | ICD-10-CM | POA: Diagnosis not present

## 2020-11-03 DIAGNOSIS — R413 Other amnesia: Secondary | ICD-10-CM | POA: Diagnosis not present

## 2020-11-03 DIAGNOSIS — E785 Hyperlipidemia, unspecified: Secondary | ICD-10-CM | POA: Diagnosis not present

## 2020-11-03 DIAGNOSIS — Y92009 Unspecified place in unspecified non-institutional (private) residence as the place of occurrence of the external cause: Secondary | ICD-10-CM | POA: Diagnosis not present

## 2020-11-03 DIAGNOSIS — Z681 Body mass index (BMI) 19 or less, adult: Secondary | ICD-10-CM | POA: Diagnosis not present

## 2020-11-03 DIAGNOSIS — S51802A Unspecified open wound of left forearm, initial encounter: Secondary | ICD-10-CM | POA: Diagnosis not present

## 2020-11-03 DIAGNOSIS — F5104 Psychophysiologic insomnia: Secondary | ICD-10-CM | POA: Diagnosis not present

## 2020-11-03 DIAGNOSIS — K5790 Diverticulosis of intestine, part unspecified, without perforation or abscess without bleeding: Secondary | ICD-10-CM | POA: Diagnosis not present

## 2020-11-03 DIAGNOSIS — I10 Essential (primary) hypertension: Secondary | ICD-10-CM | POA: Diagnosis not present

## 2020-11-03 DIAGNOSIS — J309 Allergic rhinitis, unspecified: Secondary | ICD-10-CM | POA: Diagnosis not present

## 2020-11-03 DIAGNOSIS — M159 Polyosteoarthritis, unspecified: Secondary | ICD-10-CM | POA: Diagnosis not present

## 2020-11-04 DIAGNOSIS — I471 Supraventricular tachycardia: Secondary | ICD-10-CM | POA: Diagnosis not present

## 2020-11-04 DIAGNOSIS — G309 Alzheimer's disease, unspecified: Secondary | ICD-10-CM | POA: Diagnosis not present

## 2020-11-04 DIAGNOSIS — I10 Essential (primary) hypertension: Secondary | ICD-10-CM | POA: Diagnosis not present

## 2020-11-04 DIAGNOSIS — G8929 Other chronic pain: Secondary | ICD-10-CM | POA: Diagnosis not present

## 2020-11-04 DIAGNOSIS — I739 Peripheral vascular disease, unspecified: Secondary | ICD-10-CM | POA: Diagnosis not present

## 2020-11-04 DIAGNOSIS — J309 Allergic rhinitis, unspecified: Secondary | ICD-10-CM | POA: Diagnosis not present

## 2020-11-04 DIAGNOSIS — M810 Age-related osteoporosis without current pathological fracture: Secondary | ICD-10-CM | POA: Diagnosis not present

## 2020-11-04 DIAGNOSIS — R269 Unspecified abnormalities of gait and mobility: Secondary | ICD-10-CM | POA: Diagnosis not present

## 2020-11-04 DIAGNOSIS — M1990 Unspecified osteoarthritis, unspecified site: Secondary | ICD-10-CM | POA: Diagnosis not present

## 2020-11-05 DIAGNOSIS — F5104 Psychophysiologic insomnia: Secondary | ICD-10-CM | POA: Diagnosis not present

## 2020-11-05 DIAGNOSIS — K5790 Diverticulosis of intestine, part unspecified, without perforation or abscess without bleeding: Secondary | ICD-10-CM | POA: Diagnosis not present

## 2020-11-05 DIAGNOSIS — M810 Age-related osteoporosis without current pathological fracture: Secondary | ICD-10-CM | POA: Diagnosis not present

## 2020-11-05 DIAGNOSIS — M159 Polyosteoarthritis, unspecified: Secondary | ICD-10-CM | POA: Diagnosis not present

## 2020-11-05 DIAGNOSIS — J309 Allergic rhinitis, unspecified: Secondary | ICD-10-CM | POA: Diagnosis not present

## 2020-11-05 DIAGNOSIS — I10 Essential (primary) hypertension: Secondary | ICD-10-CM | POA: Diagnosis not present

## 2020-11-05 DIAGNOSIS — I739 Peripheral vascular disease, unspecified: Secondary | ICD-10-CM | POA: Diagnosis not present

## 2020-11-05 DIAGNOSIS — E785 Hyperlipidemia, unspecified: Secondary | ICD-10-CM | POA: Diagnosis not present

## 2020-11-05 DIAGNOSIS — S51812D Laceration without foreign body of left forearm, subsequent encounter: Secondary | ICD-10-CM | POA: Diagnosis not present

## 2020-11-09 DIAGNOSIS — I739 Peripheral vascular disease, unspecified: Secondary | ICD-10-CM | POA: Diagnosis not present

## 2020-11-09 DIAGNOSIS — K5790 Diverticulosis of intestine, part unspecified, without perforation or abscess without bleeding: Secondary | ICD-10-CM | POA: Diagnosis not present

## 2020-11-09 DIAGNOSIS — J309 Allergic rhinitis, unspecified: Secondary | ICD-10-CM | POA: Diagnosis not present

## 2020-11-09 DIAGNOSIS — F5104 Psychophysiologic insomnia: Secondary | ICD-10-CM | POA: Diagnosis not present

## 2020-11-09 DIAGNOSIS — M810 Age-related osteoporosis without current pathological fracture: Secondary | ICD-10-CM | POA: Diagnosis not present

## 2020-11-09 DIAGNOSIS — S51812D Laceration without foreign body of left forearm, subsequent encounter: Secondary | ICD-10-CM | POA: Diagnosis not present

## 2020-11-09 DIAGNOSIS — I10 Essential (primary) hypertension: Secondary | ICD-10-CM | POA: Diagnosis not present

## 2020-11-09 DIAGNOSIS — E785 Hyperlipidemia, unspecified: Secondary | ICD-10-CM | POA: Diagnosis not present

## 2020-11-09 DIAGNOSIS — M159 Polyosteoarthritis, unspecified: Secondary | ICD-10-CM | POA: Diagnosis not present

## 2020-11-11 DIAGNOSIS — J309 Allergic rhinitis, unspecified: Secondary | ICD-10-CM | POA: Diagnosis not present

## 2020-11-11 DIAGNOSIS — F5104 Psychophysiologic insomnia: Secondary | ICD-10-CM | POA: Diagnosis not present

## 2020-11-11 DIAGNOSIS — K5790 Diverticulosis of intestine, part unspecified, without perforation or abscess without bleeding: Secondary | ICD-10-CM | POA: Diagnosis not present

## 2020-11-11 DIAGNOSIS — S51812D Laceration without foreign body of left forearm, subsequent encounter: Secondary | ICD-10-CM | POA: Diagnosis not present

## 2020-11-11 DIAGNOSIS — I10 Essential (primary) hypertension: Secondary | ICD-10-CM | POA: Diagnosis not present

## 2020-11-11 DIAGNOSIS — M159 Polyosteoarthritis, unspecified: Secondary | ICD-10-CM | POA: Diagnosis not present

## 2020-11-11 DIAGNOSIS — M810 Age-related osteoporosis without current pathological fracture: Secondary | ICD-10-CM | POA: Diagnosis not present

## 2020-11-11 DIAGNOSIS — I739 Peripheral vascular disease, unspecified: Secondary | ICD-10-CM | POA: Diagnosis not present

## 2020-11-11 DIAGNOSIS — E785 Hyperlipidemia, unspecified: Secondary | ICD-10-CM | POA: Diagnosis not present

## 2020-11-15 DIAGNOSIS — S51812D Laceration without foreign body of left forearm, subsequent encounter: Secondary | ICD-10-CM | POA: Diagnosis not present

## 2020-11-15 DIAGNOSIS — M159 Polyosteoarthritis, unspecified: Secondary | ICD-10-CM | POA: Diagnosis not present

## 2020-11-15 DIAGNOSIS — I739 Peripheral vascular disease, unspecified: Secondary | ICD-10-CM | POA: Diagnosis not present

## 2020-11-15 DIAGNOSIS — J309 Allergic rhinitis, unspecified: Secondary | ICD-10-CM | POA: Diagnosis not present

## 2020-11-15 DIAGNOSIS — I10 Essential (primary) hypertension: Secondary | ICD-10-CM | POA: Diagnosis not present

## 2020-11-15 DIAGNOSIS — F5104 Psychophysiologic insomnia: Secondary | ICD-10-CM | POA: Diagnosis not present

## 2020-11-15 DIAGNOSIS — E785 Hyperlipidemia, unspecified: Secondary | ICD-10-CM | POA: Diagnosis not present

## 2020-11-15 DIAGNOSIS — K5790 Diverticulosis of intestine, part unspecified, without perforation or abscess without bleeding: Secondary | ICD-10-CM | POA: Diagnosis not present

## 2020-11-15 DIAGNOSIS — M810 Age-related osteoporosis without current pathological fracture: Secondary | ICD-10-CM | POA: Diagnosis not present

## 2020-11-18 DIAGNOSIS — F5104 Psychophysiologic insomnia: Secondary | ICD-10-CM | POA: Diagnosis not present

## 2020-11-18 DIAGNOSIS — S51812D Laceration without foreign body of left forearm, subsequent encounter: Secondary | ICD-10-CM | POA: Diagnosis not present

## 2020-11-18 DIAGNOSIS — J309 Allergic rhinitis, unspecified: Secondary | ICD-10-CM | POA: Diagnosis not present

## 2020-11-18 DIAGNOSIS — M159 Polyosteoarthritis, unspecified: Secondary | ICD-10-CM | POA: Diagnosis not present

## 2020-11-18 DIAGNOSIS — I739 Peripheral vascular disease, unspecified: Secondary | ICD-10-CM | POA: Diagnosis not present

## 2020-11-18 DIAGNOSIS — K5790 Diverticulosis of intestine, part unspecified, without perforation or abscess without bleeding: Secondary | ICD-10-CM | POA: Diagnosis not present

## 2020-11-18 DIAGNOSIS — I10 Essential (primary) hypertension: Secondary | ICD-10-CM | POA: Diagnosis not present

## 2020-11-18 DIAGNOSIS — M810 Age-related osteoporosis without current pathological fracture: Secondary | ICD-10-CM | POA: Diagnosis not present

## 2020-11-18 DIAGNOSIS — E785 Hyperlipidemia, unspecified: Secondary | ICD-10-CM | POA: Diagnosis not present

## 2020-11-22 DIAGNOSIS — K5790 Diverticulosis of intestine, part unspecified, without perforation or abscess without bleeding: Secondary | ICD-10-CM | POA: Diagnosis not present

## 2020-11-22 DIAGNOSIS — I10 Essential (primary) hypertension: Secondary | ICD-10-CM | POA: Diagnosis not present

## 2020-11-22 DIAGNOSIS — I739 Peripheral vascular disease, unspecified: Secondary | ICD-10-CM | POA: Diagnosis not present

## 2020-11-22 DIAGNOSIS — F5104 Psychophysiologic insomnia: Secondary | ICD-10-CM | POA: Diagnosis not present

## 2020-11-22 DIAGNOSIS — M810 Age-related osteoporosis without current pathological fracture: Secondary | ICD-10-CM | POA: Diagnosis not present

## 2020-11-22 DIAGNOSIS — J309 Allergic rhinitis, unspecified: Secondary | ICD-10-CM | POA: Diagnosis not present

## 2020-11-22 DIAGNOSIS — S51812D Laceration without foreign body of left forearm, subsequent encounter: Secondary | ICD-10-CM | POA: Diagnosis not present

## 2020-11-22 DIAGNOSIS — E785 Hyperlipidemia, unspecified: Secondary | ICD-10-CM | POA: Diagnosis not present

## 2020-11-22 DIAGNOSIS — M159 Polyosteoarthritis, unspecified: Secondary | ICD-10-CM | POA: Diagnosis not present

## 2020-11-24 DIAGNOSIS — I10 Essential (primary) hypertension: Secondary | ICD-10-CM | POA: Diagnosis not present

## 2020-11-24 DIAGNOSIS — J309 Allergic rhinitis, unspecified: Secondary | ICD-10-CM | POA: Diagnosis not present

## 2020-11-24 DIAGNOSIS — E785 Hyperlipidemia, unspecified: Secondary | ICD-10-CM | POA: Diagnosis not present

## 2020-11-24 DIAGNOSIS — I739 Peripheral vascular disease, unspecified: Secondary | ICD-10-CM | POA: Diagnosis not present

## 2020-11-24 DIAGNOSIS — M810 Age-related osteoporosis without current pathological fracture: Secondary | ICD-10-CM | POA: Diagnosis not present

## 2020-11-24 DIAGNOSIS — K5790 Diverticulosis of intestine, part unspecified, without perforation or abscess without bleeding: Secondary | ICD-10-CM | POA: Diagnosis not present

## 2020-11-24 DIAGNOSIS — F5104 Psychophysiologic insomnia: Secondary | ICD-10-CM | POA: Diagnosis not present

## 2020-11-24 DIAGNOSIS — M159 Polyosteoarthritis, unspecified: Secondary | ICD-10-CM | POA: Diagnosis not present

## 2020-11-24 DIAGNOSIS — S51812D Laceration without foreign body of left forearm, subsequent encounter: Secondary | ICD-10-CM | POA: Diagnosis not present

## 2020-11-30 DIAGNOSIS — J309 Allergic rhinitis, unspecified: Secondary | ICD-10-CM | POA: Diagnosis not present

## 2020-11-30 DIAGNOSIS — E785 Hyperlipidemia, unspecified: Secondary | ICD-10-CM | POA: Diagnosis not present

## 2020-11-30 DIAGNOSIS — K5790 Diverticulosis of intestine, part unspecified, without perforation or abscess without bleeding: Secondary | ICD-10-CM | POA: Diagnosis not present

## 2020-11-30 DIAGNOSIS — F5104 Psychophysiologic insomnia: Secondary | ICD-10-CM | POA: Diagnosis not present

## 2020-11-30 DIAGNOSIS — M159 Polyosteoarthritis, unspecified: Secondary | ICD-10-CM | POA: Diagnosis not present

## 2020-11-30 DIAGNOSIS — I739 Peripheral vascular disease, unspecified: Secondary | ICD-10-CM | POA: Diagnosis not present

## 2020-11-30 DIAGNOSIS — I10 Essential (primary) hypertension: Secondary | ICD-10-CM | POA: Diagnosis not present

## 2020-11-30 DIAGNOSIS — M810 Age-related osteoporosis without current pathological fracture: Secondary | ICD-10-CM | POA: Diagnosis not present

## 2020-11-30 DIAGNOSIS — S51812D Laceration without foreign body of left forearm, subsequent encounter: Secondary | ICD-10-CM | POA: Diagnosis not present

## 2020-12-03 ENCOUNTER — Other Ambulatory Visit: Payer: Self-pay

## 2020-12-03 DIAGNOSIS — M1990 Unspecified osteoarthritis, unspecified site: Secondary | ICD-10-CM | POA: Insufficient documentation

## 2020-12-03 DIAGNOSIS — J45909 Unspecified asthma, uncomplicated: Secondary | ICD-10-CM | POA: Insufficient documentation

## 2020-12-03 DIAGNOSIS — F419 Anxiety disorder, unspecified: Secondary | ICD-10-CM | POA: Insufficient documentation

## 2020-12-03 DIAGNOSIS — F5104 Psychophysiologic insomnia: Secondary | ICD-10-CM | POA: Insufficient documentation

## 2020-12-03 DIAGNOSIS — I471 Supraventricular tachycardia: Secondary | ICD-10-CM | POA: Insufficient documentation

## 2020-12-03 DIAGNOSIS — M810 Age-related osteoporosis without current pathological fracture: Secondary | ICD-10-CM | POA: Insufficient documentation

## 2020-12-03 DIAGNOSIS — N3010 Interstitial cystitis (chronic) without hematuria: Secondary | ICD-10-CM | POA: Insufficient documentation

## 2020-12-03 DIAGNOSIS — E559 Vitamin D deficiency, unspecified: Secondary | ICD-10-CM | POA: Insufficient documentation

## 2020-12-03 DIAGNOSIS — D649 Anemia, unspecified: Secondary | ICD-10-CM | POA: Insufficient documentation

## 2020-12-03 DIAGNOSIS — Z8619 Personal history of other infectious and parasitic diseases: Secondary | ICD-10-CM | POA: Insufficient documentation

## 2020-12-03 DIAGNOSIS — R413 Other amnesia: Secondary | ICD-10-CM | POA: Insufficient documentation

## 2020-12-03 DIAGNOSIS — B0229 Other postherpetic nervous system involvement: Secondary | ICD-10-CM | POA: Insufficient documentation

## 2020-12-03 DIAGNOSIS — J309 Allergic rhinitis, unspecified: Secondary | ICD-10-CM | POA: Insufficient documentation

## 2020-12-08 DIAGNOSIS — F5104 Psychophysiologic insomnia: Secondary | ICD-10-CM | POA: Diagnosis not present

## 2020-12-08 DIAGNOSIS — M159 Polyosteoarthritis, unspecified: Secondary | ICD-10-CM | POA: Diagnosis not present

## 2020-12-08 DIAGNOSIS — I10 Essential (primary) hypertension: Secondary | ICD-10-CM | POA: Diagnosis not present

## 2020-12-08 DIAGNOSIS — J309 Allergic rhinitis, unspecified: Secondary | ICD-10-CM | POA: Diagnosis not present

## 2020-12-08 DIAGNOSIS — S51812D Laceration without foreign body of left forearm, subsequent encounter: Secondary | ICD-10-CM | POA: Diagnosis not present

## 2020-12-08 DIAGNOSIS — M810 Age-related osteoporosis without current pathological fracture: Secondary | ICD-10-CM | POA: Diagnosis not present

## 2020-12-08 DIAGNOSIS — I739 Peripheral vascular disease, unspecified: Secondary | ICD-10-CM | POA: Diagnosis not present

## 2020-12-08 DIAGNOSIS — K5790 Diverticulosis of intestine, part unspecified, without perforation or abscess without bleeding: Secondary | ICD-10-CM | POA: Diagnosis not present

## 2020-12-08 DIAGNOSIS — E785 Hyperlipidemia, unspecified: Secondary | ICD-10-CM | POA: Diagnosis not present

## 2020-12-09 ENCOUNTER — Ambulatory Visit: Payer: Medicare PPO | Admitting: Cardiology

## 2020-12-10 ENCOUNTER — Encounter: Payer: Self-pay | Admitting: Cardiology

## 2020-12-14 DIAGNOSIS — K5790 Diverticulosis of intestine, part unspecified, without perforation or abscess without bleeding: Secondary | ICD-10-CM | POA: Diagnosis not present

## 2020-12-14 DIAGNOSIS — S51812D Laceration without foreign body of left forearm, subsequent encounter: Secondary | ICD-10-CM | POA: Diagnosis not present

## 2020-12-14 DIAGNOSIS — I10 Essential (primary) hypertension: Secondary | ICD-10-CM | POA: Diagnosis not present

## 2020-12-14 DIAGNOSIS — I739 Peripheral vascular disease, unspecified: Secondary | ICD-10-CM | POA: Diagnosis not present

## 2020-12-14 DIAGNOSIS — M159 Polyosteoarthritis, unspecified: Secondary | ICD-10-CM | POA: Diagnosis not present

## 2020-12-14 DIAGNOSIS — M810 Age-related osteoporosis without current pathological fracture: Secondary | ICD-10-CM | POA: Diagnosis not present

## 2020-12-14 DIAGNOSIS — J309 Allergic rhinitis, unspecified: Secondary | ICD-10-CM | POA: Diagnosis not present

## 2020-12-14 DIAGNOSIS — E785 Hyperlipidemia, unspecified: Secondary | ICD-10-CM | POA: Diagnosis not present

## 2020-12-14 DIAGNOSIS — F5104 Psychophysiologic insomnia: Secondary | ICD-10-CM | POA: Diagnosis not present

## 2020-12-16 DIAGNOSIS — K5790 Diverticulosis of intestine, part unspecified, without perforation or abscess without bleeding: Secondary | ICD-10-CM | POA: Diagnosis not present

## 2020-12-16 DIAGNOSIS — F5104 Psychophysiologic insomnia: Secondary | ICD-10-CM | POA: Diagnosis not present

## 2020-12-16 DIAGNOSIS — M159 Polyosteoarthritis, unspecified: Secondary | ICD-10-CM | POA: Diagnosis not present

## 2020-12-16 DIAGNOSIS — M810 Age-related osteoporosis without current pathological fracture: Secondary | ICD-10-CM | POA: Diagnosis not present

## 2020-12-16 DIAGNOSIS — J309 Allergic rhinitis, unspecified: Secondary | ICD-10-CM | POA: Diagnosis not present

## 2020-12-16 DIAGNOSIS — E785 Hyperlipidemia, unspecified: Secondary | ICD-10-CM | POA: Diagnosis not present

## 2020-12-16 DIAGNOSIS — I739 Peripheral vascular disease, unspecified: Secondary | ICD-10-CM | POA: Diagnosis not present

## 2020-12-16 DIAGNOSIS — S51812D Laceration without foreign body of left forearm, subsequent encounter: Secondary | ICD-10-CM | POA: Diagnosis not present

## 2020-12-16 DIAGNOSIS — I10 Essential (primary) hypertension: Secondary | ICD-10-CM | POA: Diagnosis not present

## 2021-02-11 DIAGNOSIS — M159 Polyosteoarthritis, unspecified: Secondary | ICD-10-CM | POA: Diagnosis not present

## 2021-02-11 DIAGNOSIS — E559 Vitamin D deficiency, unspecified: Secondary | ICD-10-CM | POA: Diagnosis not present

## 2021-02-11 DIAGNOSIS — Z8673 Personal history of transient ischemic attack (TIA), and cerebral infarction without residual deficits: Secondary | ICD-10-CM | POA: Diagnosis not present

## 2021-02-11 DIAGNOSIS — Z681 Body mass index (BMI) 19 or less, adult: Secondary | ICD-10-CM | POA: Diagnosis not present

## 2021-02-11 DIAGNOSIS — E785 Hyperlipidemia, unspecified: Secondary | ICD-10-CM | POA: Diagnosis not present

## 2021-02-11 DIAGNOSIS — R413 Other amnesia: Secondary | ICD-10-CM | POA: Diagnosis not present

## 2021-02-11 DIAGNOSIS — I471 Supraventricular tachycardia: Secondary | ICD-10-CM | POA: Diagnosis not present

## 2021-02-11 DIAGNOSIS — I10 Essential (primary) hypertension: Secondary | ICD-10-CM | POA: Diagnosis not present

## 2021-02-11 DIAGNOSIS — J309 Allergic rhinitis, unspecified: Secondary | ICD-10-CM | POA: Diagnosis not present

## 2021-04-04 DIAGNOSIS — U071 COVID-19: Secondary | ICD-10-CM | POA: Diagnosis not present

## 2021-07-02 DEATH — deceased
